# Patient Record
Sex: Female | Born: 1950
Health system: Southern US, Community
[De-identification: ages and names within clinical notes are randomized; demographics above are authoritative.]

## PROBLEM LIST (undated history)

## (undated) DIAGNOSIS — E559 Vitamin D deficiency, unspecified: Secondary | ICD-10-CM

## (undated) DIAGNOSIS — M5412 Radiculopathy, cervical region: Secondary | ICD-10-CM

## (undated) DIAGNOSIS — K219 Gastro-esophageal reflux disease without esophagitis: Secondary | ICD-10-CM

## (undated) DIAGNOSIS — K645 Perianal venous thrombosis: Secondary | ICD-10-CM

## (undated) DIAGNOSIS — M51379 Other intervertebral disc degeneration, lumbosacral region without mention of lumbar back pain or lower extremity pain: Secondary | ICD-10-CM

## (undated) DIAGNOSIS — R739 Hyperglycemia, unspecified: Secondary | ICD-10-CM

## (undated) DIAGNOSIS — M9420 Chondromalacia, unspecified site: Secondary | ICD-10-CM

## (undated) DIAGNOSIS — M5137 Other intervertebral disc degeneration, lumbosacral region: Secondary | ICD-10-CM

## (undated) DIAGNOSIS — R760 Raised antibody titer: Secondary | ICD-10-CM

## (undated) DIAGNOSIS — K2950 Unspecified chronic gastritis without bleeding: Secondary | ICD-10-CM

## (undated) DIAGNOSIS — K5909 Other constipation: Secondary | ICD-10-CM

## (undated) DIAGNOSIS — R202 Paresthesia of skin: Secondary | ICD-10-CM

## (undated) DIAGNOSIS — M7020 Olecranon bursitis, unspecified elbow: Secondary | ICD-10-CM

## (undated) DIAGNOSIS — M542 Cervicalgia: Secondary | ICD-10-CM

## (undated) DIAGNOSIS — E2839 Other primary ovarian failure: Secondary | ICD-10-CM

## (undated) DIAGNOSIS — E785 Hyperlipidemia, unspecified: Secondary | ICD-10-CM

## (undated) DIAGNOSIS — M8580 Other specified disorders of bone density and structure, unspecified site: Secondary | ICD-10-CM

## (undated) HISTORY — DX: Radiculopathy, cervical region: M54.12

## (undated) HISTORY — DX: Hyperglycemia, unspecified: R73.9

## (undated) HISTORY — DX: Other constipation: K59.09

## (undated) HISTORY — DX: Other primary ovarian failure: E28.39

## (undated) HISTORY — DX: Chondromalacia, unspecified site: M94.20

## (undated) HISTORY — DX: Vitamin D deficiency, unspecified: E55.9

## (undated) HISTORY — DX: Perianal venous thrombosis: K64.5

## (undated) HISTORY — DX: Olecranon bursitis, unspecified elbow: M70.20

## (undated) HISTORY — DX: Hyperlipidemia, unspecified: E78.5

## (undated) HISTORY — DX: Paresthesia of skin: R20.2

## (undated) HISTORY — DX: Unspecified chronic gastritis without bleeding: K29.50

## (undated) HISTORY — DX: Raised antibody titer: R76.0

## (undated) HISTORY — PX: DILATION AND CURETTAGE OF UTERUS: SHX78

## (undated) HISTORY — DX: Cervicalgia: M54.2

## (undated) HISTORY — DX: Other intervertebral disc degeneration, lumbosacral region without mention of lumbar back pain or lower extremity pain: M51.379

## (undated) HISTORY — DX: Other specified disorders of bone density and structure, unspecified site: M85.80

## (undated) HISTORY — DX: Other intervertebral disc degeneration, lumbosacral region: M51.37

---
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
## 1989-02-28 HISTORY — PX: CHOLECYSTECTOMY: SHX55

## 2002-03-06 ENCOUNTER — Other Ambulatory Visit: Admission: RE | Admit: 2002-03-06 | Discharge: 2002-03-06 | Payer: Self-pay | Admitting: Obstetrics and Gynecology

## 2003-05-13 ENCOUNTER — Other Ambulatory Visit: Admission: RE | Admit: 2003-05-13 | Discharge: 2003-05-13 | Payer: Self-pay | Admitting: Obstetrics and Gynecology

## 2004-02-26 ENCOUNTER — Ambulatory Visit: Payer: Self-pay | Admitting: *Deleted

## 2004-05-12 ENCOUNTER — Encounter: Admission: RE | Admit: 2004-05-12 | Discharge: 2004-08-10 | Payer: Self-pay | Admitting: Internal Medicine

## 2004-06-21 ENCOUNTER — Other Ambulatory Visit: Admission: RE | Admit: 2004-06-21 | Discharge: 2004-06-21 | Payer: Self-pay | Admitting: Obstetrics and Gynecology

## 2004-10-07 ENCOUNTER — Ambulatory Visit: Payer: Self-pay | Admitting: Family Medicine

## 2009-07-01 ENCOUNTER — Ambulatory Visit: Payer: Self-pay | Admitting: Unknown Physician Specialty

## 2009-08-28 LAB — HM COLONOSCOPY

## 2009-09-09 ENCOUNTER — Ambulatory Visit: Payer: Self-pay | Admitting: Unknown Physician Specialty

## 2009-09-15 LAB — PATHOLOGY REPORT

## 2009-10-09 ENCOUNTER — Ambulatory Visit: Payer: Self-pay | Admitting: Family Medicine

## 2010-06-30 ENCOUNTER — Ambulatory Visit (HOSPITAL_BASED_OUTPATIENT_CLINIC_OR_DEPARTMENT_OTHER)
Admission: RE | Admit: 2010-06-30 | Discharge: 2010-07-01 | Disposition: A | Discharge status: Home / Self Care | Payer: Worker's Compensation | Source: Ambulatory Visit | Attending: Orthopedic Surgery | Admitting: Orthopedic Surgery

## 2010-06-30 DIAGNOSIS — M24139 Other articular cartilage disorders, unspecified wrist: Secondary | ICD-10-CM | POA: Insufficient documentation

## 2010-06-30 DIAGNOSIS — Z01810 Encounter for preprocedural cardiovascular examination: Secondary | ICD-10-CM | POA: Insufficient documentation

## 2010-06-30 DIAGNOSIS — M249 Joint derangement, unspecified: Secondary | ICD-10-CM | POA: Insufficient documentation

## 2010-06-30 DIAGNOSIS — Z01812 Encounter for preprocedural laboratory examination: Secondary | ICD-10-CM | POA: Insufficient documentation

## 2010-06-30 DIAGNOSIS — G5620 Lesion of ulnar nerve, unspecified upper limb: Secondary | ICD-10-CM | POA: Insufficient documentation

## 2010-06-30 LAB — POCT HEMOGLOBIN-HEMACUE: Hemoglobin: 13.1 g/dL (ref 12.0–15.0)

## 2010-07-01 HISTORY — PX: OTHER SURGICAL HISTORY: SHX169

## 2010-09-17 NOTE — Op Note (Signed)
NAMELASHANTE, Dana Arnold                  ACCOUNT NO.:  [id number]  MEDICAL RECORD NO.:  [id number]          PATIENT TYPE:  LOCATION:                                 FACILITY:  PHYSICIAN:  Cindee Salt, M.D.            DATE OF BIRTH:  DATE OF PROCEDURE:  06/30/2010 DATE OF DISCHARGE:                              OPERATIVE REPORT   PREOPERATIVE DIAGNOSIS:  Cubital tunnel syndrome, ulnocarpal abutment, right arm.  POSTOPERATIVE DIAGNOSIS:  Cubital tunnel syndrome, ulnocarpal abutment, right arm.  OPERATIONS: 1. Arthroscopy, right wrist with ulnar shortening osteotomy using 2/4     modular hand plate. 2. Decompression, ulnar nerve, left elbow with debridement triangular     fibrocartilage complex arthroscopically.  SURGEON:  Cindee Salt, MD  ANESTHESIA:  Supraclavicular block general.  ANESTHESIOLOGIST:  Janetta Hora. Gelene Mink, MD.  HISTORY:  The patient is a 60 year old female with a history of ulnar- sided wrist pain, numbness, and tingling.  EMG nerve conduction is positive with changes in her ulnar nerve.  MRI reveals an ulnocarpal abutment with changes in the ulnar aspect of the lunate.  A triangular fibrocartilage complex tear.  We have discussed possibility of surgical decompression, the ulnar shortening osteotomy with arthroscopy, debridement of the wrist, cubital tunnel decompression, possible transposition.  Pre, peri, and postoperative courses have been discussed along with risks and complication.  She is aware that there is no guarantee with the surgery, possibility of infection, recurrence of injury to arteries, nerves, and tendons, incomplete relief of symptoms, dystrophy, possibility of nonunion, delayed union, malunion of the osteotomy site.  In the preoperative area, the patient was seen.  The extremity was marked by both the patient and surgeon.  Antibiotic was given.  DESCRIPTION OF PROCEDURE:  The patient was brought to the operating room where a supraclavicular  block was carried out without difficulty under the direction of Dr. Gelene Mink.  She was prepped using ChloraPrep in supine position with right arm free.  A 3-minute dry time was allowed. Time-out was taken, confirming the patient and procedure.  Following adequate anesthesia, general anesthetic was also given.  She was placed in the arthroscopy tower, 10 pounds of traction was applied.  The joint was inflated to 3/4 portal.  A transverse incision was made through skin only and deepened with a hemostat.  A blunt trocar was used to enter the joint.  Joint was inspected.  Volar radial wrist ligaments were intact. There was stretching of the scapholunate ligament complex.  There were changes on the dorsal aspect of the lunate.  On the ulnar aspect, a central tear of the triangular fibrocartilage complex was noted, indicative of an ulnocarpal abutment.  Rotation of the ulnar head revealed loss of cartilage on the radial aspect of the ulnar head.  A kissing lesion was present on the ulnar aspect of the lunate.  A lunotriquetral tear was noted.  There was significant degenerative changes in the lunotriquetral area with significant fibrillation of the cartilage.  An irrigation catheter was placed in 6U.  A 4-5 portal was opened after localization with 22-gauge needle,  deepened with a hemostat.  A blunt trocar used to enter the joint.  Two-mm shaver was then introduced and debridement was performed.  The scope was then introduced in the ulnar portals.  These allowed visualization of the lunotriquetral tear.  The kissing lesions on the ulna and lunate, a further debridement was performed after returning the scope to the 3/4 portal including partial synovectomy.  The instruments were removed. The limb was removed from the arthroscopy tower.  The tourniquet was then inflated after exsanguinating the limb with an Esmarch bandage. The tourniquet placed high and the arm was inflated to 250  mmHg. Curvilinear incision was then made over the ulnar aspect of the forearm, carried down through subcutaneous tissue.  Bleeders were electrocauterized with bipolar.  Dissection carried between the extensor carpi ulnaris, abductor digiti quinti down to the shaft of the ulna. Periosteum was incised and elevated.  A 7-hole 2/4 modular plate was then placed.  Three holes were drilled distally and screws implanted. These were 11 and 12 mm.  The screws were removed.  A positioning for the osteotomy was then marked.  A osteotome was used to groove the ulna to prevent rotational malalignment.  An oscillating saw with irrigation was then used to remove a segment of bone approximately 1/2-3/4 cm in length.  This one done in the parallel manner.  The screws were then placed onto the plate distally, firmly fixing it distally.  A 4/5 K-wire was placed into one of the proximal holes.  A clamp placed.  The oblique osteotomy was then closed, clamped in position, and an oblique screw measuring 14 mm was then placed.  This was overdrilled on the proximal cortex.  This allowed compression of the osteotomy site.  The proximal screws were then placed.  These were 12 and 11 mm.  These were each eccentrically placed, so as to provide further compression after removal of the oblique screw.  X-rays confirmed positioning of the osteotomy site.  The plate is in good position.  The bone graft was then soaked for 10 minutes during this period of time.  The wound was packed after irrigation.  A separate incision was then made over the medial aspect of the elbow over the medial epicondyle, carried down through subcutaneous tissue.  Bleeders again electrocauterized with bipolar.  An anconeus epitrochlearis muscle was encountered.  This was incised and its anterior aspect of the ulnar nerve was identified.  The fascia was then dissected from the overlying subcutaneous tissue, taking care to protect any superficial  nerves.  Two knee retractors were placed.  This allowed a fasciotomy of the flexor carpi ulnaris to be performed for approximately 6-8 cm proximally.  The muscle belly was then split distally.  The KMI carpal tunnel guide was then placed between the ulnar nerve and the deep fascia and this was released with a ENT angled scissors proximally for the entire aspect.  The nerve was visualized, found to be intact over its entire course.  Bleeders were again electrocauterized with bipolar.  The dissection was then carried proximally.  The knee retractors placed after dissecting the subcutaneous tissue from its proximal fascia.  In a similar manner, the nerve was released proximally with the [facility] guide.  This was done to the arcade of [place].  The area was irrigated.  The elbow flexed.  No subluxation to the nerve was noted.  The fascia anteriorly from the medial epicondyle was sutured to the posterior skin flap with 2-0 Vicryl sutures and the  subcutaneous tissue with interrupted 2-0 Vicryl and skin with a subcuticular 4-0 Vicryl Rapide.  The bone graft was then inserted onto the ulnar shaft osteotomy.  This was packed after irrigation and sutured into position using 2-0 Vicryl and into the periosteum.  The overlying subcutaneous tissue was then closed with interrupted 4-0 Vicryl sutures and the skin with subcuticular 4-0 Vicryl Rapide.  The portals were also closed with 4-0 Vicryl Rapide.  X-rays confirmed positioning of the osteotomy site.  Full pronation, supination was noted.  A sterile compressive dressing long-arm splint applied with the elbow flexed approximately 45 degrees.  On deflation of the tourniquet, all fingers immediately pinked.  She was taken to the recovery room for observation in satisfactory condition.  She will be admitted for overnight stay, discharged home on Percocet as determined by pain and comfort.          ______________________________ Cindee Salt,  M.D.     GK/MEDQ  D:  06/30/2010  T:  07/01/2010  Job:  [id number]  Electronically Signed by Cindee Salt M.D. on 09/17/2010 09:11:56 AM

## 2010-12-30 LAB — HM MAMMOGRAPHY: HM Mammogram: NORMAL

## 2011-01-04 ENCOUNTER — Other Ambulatory Visit: Payer: Self-pay | Admitting: Obstetrics and Gynecology

## 2011-01-04 DIAGNOSIS — R928 Other abnormal and inconclusive findings on diagnostic imaging of breast: Secondary | ICD-10-CM

## 2011-12-30 LAB — HM PAP SMEAR: HM Pap smear: NORMAL

## 2012-01-09 ENCOUNTER — Telehealth: Payer: Self-pay | Admitting: Oncology

## 2012-01-09 NOTE — Telephone Encounter (Signed)
LVOM for pt to return call.  °

## 2012-01-10 ENCOUNTER — Telehealth: Payer: Self-pay | Admitting: Oncology

## 2012-01-10 NOTE — Telephone Encounter (Signed)
C/D 01/10/12 for appt. 01/12/12

## 2012-01-10 NOTE — Telephone Encounter (Signed)
S/W pt in re NP appt 11/14 @ 2:30 w/Dr. Arline Asp  Referring Dr. Henderson Cloud Dx-Abn Coags/Famiy HX Welcome packet emailed.

## 2012-01-12 ENCOUNTER — Encounter: Payer: Self-pay | Admitting: Oncology

## 2012-01-12 ENCOUNTER — Ambulatory Visit (HOSPITAL_BASED_OUTPATIENT_CLINIC_OR_DEPARTMENT_OTHER): Payer: Worker's Compensation

## 2012-01-12 ENCOUNTER — Ambulatory Visit (HOSPITAL_BASED_OUTPATIENT_CLINIC_OR_DEPARTMENT_OTHER): Payer: BC Managed Care – PPO | Admitting: Oncology

## 2012-01-12 ENCOUNTER — Other Ambulatory Visit: Payer: BC Managed Care – PPO | Admitting: Lab

## 2012-01-12 VITALS — BP 126/72 | HR 78 | Temp 97.7°F | Resp 20 | Ht 64.5 in | Wt 131.0 lb

## 2012-01-12 DIAGNOSIS — R7989 Other specified abnormal findings of blood chemistry: Secondary | ICD-10-CM

## 2012-01-12 NOTE — Progress Notes (Signed)
This office note has been dictated.  #[id number]

## 2012-01-13 NOTE — Progress Notes (Signed)
CC:   Guy Sandifer. Henderson Cloud, M.D.  PROBLEM LIST: 1. Abnormal blood test on hypercoagulable panel. 2. Injury to right arm after falling in November 2011 status post     surgery by Dr. Cindee Salt in May 2012. 3. Bursitis involving right knee since January 2012. 4. Degenerative disk disease since 2001. 5. Right retinal tear in September 2011 treated with laser. 6. History of esophageal stricture in 2007 and 2011. 7. Elevated TSH noted on 01/05/2012.  MEDICATIONS: 1. Cholecalciferol 2000 units daily. 2. Flaxseed oil 1 tablespoons daily. 3. Lactobacillus probiotic 1 tablespoon daily. 4. Multivitamins 1 capsule daily. 5. Vitamin C 1000 mg daily.  SMOKING HISTORY:  The patient has never smoked cigarettes.  HISTORY:  Dana Arnold is a 61 year old, white, married female whom I am asked to see in consultation by Dr. Harold Hedge because of an abnormal blood test on a hypercoagulable panel which was carried out on 01/15/2012.  In addition the patient expresses some concerns about her family history.  She is concerned that there may be a predisposing tendency in her family for blood clots or she said "thick blood."  The patient has been generally healthy.  She has no personal history of blood clots or anything in her personal history to suggest a hypercoagulable state.  Her family history will be reviewed in detail below however.  Her family history also fails to reveal any major suggestion of a hypercoagulable state with the exception of a recent injury to her right arm after falling in November 2011 and subsequent surgery in May 2012, the patient seems to be enjoying excellent health. She does have a lot of arthritis particularly in her back but also involving her right knee and feet.  PAST MEDICAL HISTORY:  Fairly unremarkable as noted above.  SURGICAL HISTORY:  Consists of a T and A in 1961.  D and C in 1983, laparoscopic cholecystectomy in January 1991 at [facility] complicated by  postoperative bleeding requiring reoperation.  The patient has had injections into her right knee of steroids.  She has had 2 miscarriages but 3 successful pregnancies resulting in adult children, ages 79, 54 and 78.  No history of any significant complications related to her pregnancies specifically, blood clots.  No history of blood transfusions.  ALLERGIES:  No allergies to any medicines.  FAMILY HISTORY:  Mother apparently had phlebitis at 1 time.  She suffered strokes at age 70 and  age 74, apparently is on Coumadin at this time.  Father died at age 49 of a ruptured cerebral aneurysm. Maternal grandmother apparently had a cerebral hemorrhage at age 68 and 59.  Maternal grandfather had prostate cancer.  The patient has sister age 64 with hypertension, a sister age 85.  She has 3 adult children. No grandchildren.  There is no history of any blood clots in these relatives.  No history of bleeding tendencies.  SOCIAL HISTORY:  [name] denies any history of tobacco or alcohol use. She lives in [place] with her husband.  They have been married 37 years.  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  She continues to work.  She is active and drives.  She is unable to do exercises as she had been doing prior to her right arm injury.  REVIEW OF SYSTEMS:  She denies any neurologic problems.  Vision and hearing are okay.  She did have a right retinal tear in September 2011 treated with laser.  The patient has had intermittent sinus problems requiring  antibiotics.  There have been episodes where she has had C difficile as a result of antibiotic therapy.  She apparently has undergone dilatation of an esophageal stricture associated with dysphagia.  She has tendency toward constipation.  No history of liver problems.  No cardiac or respiratory symptoms.  She has yearly mammogram and Pap test.  She stopped having menstrual periods at age 11.  No history presently of hot  flashes, but she did have hot flashes.  She had been on birth control pills in the past for a year and a half.  She had been on progestational agents for about 4 years, but stopped these within the past few months.  Again, no history of blood clots.  She has had urinary tract infections in the past.  No history of leg swelling intermittent claudication or phlebitis.  No history of abnormal bleeding or bruising.  She has arthritis involving her low back, neck, feet, right knee and right arm.  No fever, chills or night sweats.  No skin rashes or pruritus.  No history of depression.  PHYSICAL EXAM:  [name] is a well-appearing woman of 61.  She looks healthy.  Weight is 131 pounds, height 5 feet 4-1/2 inches, body surface area 1.64 m squared.  Blood pressure 126/72.  Other vital signs are normal.  There is no scleral icterus.  Pupillary and extraocular movements are normal.  Mouth and pharynx are benign.  Dentition is good. Neck is without adenopathy, thyroid enlargement or bruit.  Heart and lungs are normal.  Breasts are not examined.  Back:  No skeletal tenderness or deformity.  No axillary or inguinal adenopathy.  Abdomen: Reveals a midline vertical scar from her gallbladder surgery.  No organomegaly or masses.  No tenderness.  Extremities:  No peripheral edema, clubbing, palmar erythema, petechiae, or purpura.  She has some arthritic changes in her fingers.  Neurologic exam is normal.  LABORATORY DATA:  None was carried out today.  We have a hypercoagulable panel from 01/05/2012 ordered by Dr. Harold Hedge.  The patient was given a copy of this study and results were reviewed with her.  The results are normal with the exception of total protein C which came back 69% with normal being 72-160%.  On the other hand, the protein C functional, which is the activity is 194%, which if anything is increased but of no clinical significance.  In short, the patient does not seem to have a  protein C deficiency on the basis of the functional assay.  She does not have lupus anticoagulant.  Mutational analysis for factor V Leiden and prothrombin II gene mutation were negative. Antithrombin III protein C and protein S were all normal.  Cardiolipin and beta 2 glycoprotein antibodies were normal.  CBC and BMET were normal.  A TSH however, was 5.599 with normal being 0.350-4.500.  This was pointed out to the patient.  She was encouraged to have this repeated with thyroid hormone levels.  She may need a small dose of levothyroxine.  IMPRESSION AND PLAN:  The patient was reassured.  There was nothing in her lab work specifically the hypercoagulable panel or in her family history that would make one think that she was at increased risk for thrombotic events, either venous or arterial.  The patient was given a copy of her lab work.  No followup appointment is indicated or was made. However, I will would be happy to see Ms. Lofland again, should any questions or concerns arise in the  future.    ______________________________ Samul Dada, M.D. DSM/MEDQ  D:  01/12/2012  T:  01/13/2012  Job:  [id number]

## 2012-03-09 ENCOUNTER — Ambulatory Visit: Payer: Self-pay | Admitting: Family Medicine

## 2013-01-30 ENCOUNTER — Ambulatory Visit: Payer: Self-pay | Admitting: Family Medicine

## 2013-01-30 LAB — HM DEXA SCAN: HM Dexa Scan: ABNORMAL

## 2013-04-18 ENCOUNTER — Ambulatory Visit: Payer: Self-pay | Admitting: Unknown Physician Specialty

## 2013-04-23 LAB — PATHOLOGY REPORT

## 2014-05-26 HISTORY — PX: OTHER SURGICAL HISTORY: SHX169

## 2014-12-24 ENCOUNTER — Encounter: Payer: Self-pay | Admitting: Family Medicine

## 2014-12-24 ENCOUNTER — Ambulatory Visit (INDEPENDENT_AMBULATORY_CARE_PROVIDER_SITE_OTHER): Payer: BC Managed Care – PPO | Admitting: Family Medicine

## 2014-12-24 ENCOUNTER — Encounter (INDEPENDENT_AMBULATORY_CARE_PROVIDER_SITE_OTHER): Payer: Self-pay

## 2014-12-24 VITALS — BP 120/80 | HR 101 | Temp 98.3°F | Resp 16 | Ht 65.0 in | Wt 131.2 lb

## 2014-12-24 DIAGNOSIS — M8580 Other specified disorders of bone density and structure, unspecified site: Secondary | ICD-10-CM | POA: Diagnosis not present

## 2014-12-24 DIAGNOSIS — M5136 Other intervertebral disc degeneration, lumbar region: Secondary | ICD-10-CM | POA: Diagnosis not present

## 2014-12-24 DIAGNOSIS — E038 Other specified hypothyroidism: Secondary | ICD-10-CM | POA: Insufficient documentation

## 2014-12-24 DIAGNOSIS — E559 Vitamin D deficiency, unspecified: Secondary | ICD-10-CM

## 2014-12-24 DIAGNOSIS — R790 Abnormal level of blood mineral: Secondary | ICD-10-CM | POA: Diagnosis not present

## 2014-12-24 DIAGNOSIS — Z8619 Personal history of other infectious and parasitic diseases: Secondary | ICD-10-CM | POA: Insufficient documentation

## 2014-12-24 DIAGNOSIS — M1990 Unspecified osteoarthritis, unspecified site: Secondary | ICD-10-CM | POA: Insufficient documentation

## 2014-12-24 DIAGNOSIS — Z23 Encounter for immunization: Secondary | ICD-10-CM | POA: Diagnosis not present

## 2014-12-24 DIAGNOSIS — M629 Disorder of muscle, unspecified: Secondary | ICD-10-CM

## 2014-12-24 DIAGNOSIS — M5412 Radiculopathy, cervical region: Secondary | ICD-10-CM | POA: Diagnosis not present

## 2014-12-24 DIAGNOSIS — M159 Polyosteoarthritis, unspecified: Secondary | ICD-10-CM

## 2014-12-24 DIAGNOSIS — M25511 Pain in right shoulder: Secondary | ICD-10-CM | POA: Diagnosis not present

## 2014-12-24 DIAGNOSIS — Z13220 Encounter for screening for lipoid disorders: Secondary | ICD-10-CM | POA: Diagnosis not present

## 2014-12-24 DIAGNOSIS — M5382 Other specified dorsopathies, cervical region: Secondary | ICD-10-CM

## 2014-12-24 DIAGNOSIS — R739 Hyperglycemia, unspecified: Secondary | ICD-10-CM | POA: Diagnosis not present

## 2014-12-24 DIAGNOSIS — K257 Chronic gastric ulcer without hemorrhage or perforation: Secondary | ICD-10-CM | POA: Insufficient documentation

## 2014-12-24 DIAGNOSIS — K644 Residual hemorrhoidal skin tags: Secondary | ICD-10-CM | POA: Insufficient documentation

## 2014-12-24 DIAGNOSIS — M150 Primary generalized (osteo)arthritis: Secondary | ICD-10-CM

## 2014-12-24 DIAGNOSIS — M8949 Other hypertrophic osteoarthropathy, multiple sites: Secondary | ICD-10-CM

## 2014-12-24 DIAGNOSIS — E039 Hypothyroidism, unspecified: Secondary | ICD-10-CM

## 2014-12-24 DIAGNOSIS — R760 Raised antibody titer: Secondary | ICD-10-CM

## 2014-12-24 DIAGNOSIS — M11261 Other chondrocalcinosis, right knee: Secondary | ICD-10-CM | POA: Insufficient documentation

## 2014-12-24 MED ORDER — DICLOFENAC SODIUM 1 % TD GEL: 4.0000 g | Freq: Four times a day (QID) | TRANSDERMAL | Status: DC

## 2014-12-24 MED ORDER — METAXALONE 800 MG PO TABS: 800.0000 mg | ORAL_TABLET | Freq: Three times a day (TID) | ORAL | Status: DC

## 2014-12-24 NOTE — Progress Notes (Signed)
Name: Dana Arnold   MRN: [id number]    DOB: 01-Sep-1950   Date:12/24/2014       Progress Note  Subjective  Chief Complaint  Chief Complaint  Patient presents with  . Shoulder Pain    right shoulder blade onset 6 weeks worsening.  unknown trauma    HPI  Right Shoulder pain: symptoms started 6 weeks ago, sudden onset, pain on right shoulder blade, at times also has pain on the right anterior shoulder, normal of motion, pain is described as sharp pain, intermittently.  Pain at this time 2-3/10.  Not sure of aggravating or alleviating factors. No change in activity or trauma.  No rashes. Neck pain is still present and decrease in rom.  She states the two pains do not seem to be related  Osteopenia: she is not taking medication for that, even though FRAX was high, she was afraid of taking medication.  Explained the need to reconsider since family history of osteoporosis.   Subclinical hypothyroidism; she denies weight gain, has fatigue ( but stressed about her mother that is in a nursing home and her daughter also had complications during her pregnancy)  Hyperglycemia: denies polyphagia, polydipsia or polyuria. No family history of DM.   Primary OA; on hands, feet, DDD lumbar spine, using Voltaren gel to control symptoms.    Patient Active Problem List   Diagnosis Date Noted  . Arthritis, degenerative 12/24/2014  . Lupus anticoagulant positive 12/24/2014  . Hyperglycemia 12/24/2014  . Cervical radiculitis 12/24/2014  . Subclinical hypothyroidism 12/24/2014  . Vitamin D deficiency 12/24/2014  . Hx of Clostridium difficile infection 12/24/2014  . External hemorrhoid 12/24/2014  . Chronic gastric erosion 12/24/2014  . Chondrocalcinosis of right knee 12/24/2014  . Osteopenia 12/24/2014  . DDD (degenerative disc disease), lumbar 12/24/2014    Past Surgical History  Procedure Laterality Date  . Arthroscopic knee Right 05/26/2014  . Right ulna surgery Right 07/01/2010  .  Tonsillectomy and adenoidectomy Bilateral 1961  . Cholecystectomy  02/1989    Family History  Problem Relation Age of Onset  . Osteoporosis Mother   . Aneurysm Father     Social History   Social History  . Marital Status: Married    Spouse Name: N/A  . Number of Children: N/A  . Years of Education: N/A   Occupational History  . Not on file.   Social History Main Topics  . Smoking status: Never Smoker   . Smokeless tobacco: Never Used  . Alcohol Use: No  . Drug Use: No  . Sexual Activity: Yes    Birth Control/ Protection: Post-menopausal   Other Topics Concern  . Not on file   Social History Narrative     Current outpatient prescriptions:  .  magnesium 30 MG tablet, Take 100 mg by mouth 2 (two) times daily., Disp: , Rfl:  .  cholecalciferol (VITAMIN D) 1000 UNITS tablet, Take 2,000 Units by mouth daily., Disp: , Rfl:  .  diclofenac sodium (VOLTAREN) 1 % GEL, Apply 4 g topically 4 (four) times daily., Disp: 100 g, Rfl: 2 .  Lactobacillus (PROBIOTIC ACIDOPHILUS PO), Take by mouth. One tablespoon daily, Disp: , Rfl:   Not on File   ROS  Constitutional: Negative for fever or weight change.  Respiratory: Negative for cough and shortness of breath.   Cardiovascular: Negative for chest pain or palpitations.  Gastrointestinal: Negative for abdominal pain, no bowel changes.  Musculoskeletal: Negative for gait problem or joint swelling.  Skin: Negative for rash.  Neurological: Negative for dizziness , at times some  headache.  No other specific complaints in a complete review of systems (except as listed in HPI above).  Objective  Filed Vitals:   12/24/14 0814  BP: 120/80  Pulse: 101  Temp: 98.3 F (36.8 C)  TempSrc: Oral  Resp: 16  Height: 5\' 5"  (1.651 m)  Weight: 131 lb 3.2 oz (59.512 kg)  SpO2: 97%    Body mass index is 21.83 kg/(m^2).  Physical Exam  Constitutional: Patient appears well-developed and well-nourished. No distress.  HEENT: head  atraumatic, normocephalic, pupils equal and reactive to light, neck supple, throat within normal limits Cardiovascular: Normal rate, regular rhythm and normal heart sounds.  No murmur heard. No BLE edema. Pulmonary/Chest: Effort normal and breath sounds normal. No respiratory distress. Abdominal: Soft.  There is no tenderness. Muscular Skeletal: PIP, DIP are  Enlarged,  Normal ROM of shoulder, tender during palpation of trapezium muscle from neck down to thoracic spine and insertion of shoulder Psychiatric: Patient has a normal mood and affect. behavior is normal. Judgment and thought content normal.   PHQ2/9: Depression screen PHQ 2/9 12/24/2014  Decreased Interest 0  Down, Depressed, Hopeless 0  PHQ - 2 Score 0    Fall Risk: Fall Risk  12/24/2014  Falls in the past year? No    Functional Status Survey: Is the patient deaf or have difficulty hearing?: No Does the patient have difficulty seeing, even when wearing glasses/contacts?: Yes (reading glasses) Does the patient have difficulty concentrating, remembering, or making decisions?: No Does the patient have difficulty walking or climbing stairs?: No Does the patient have difficulty dressing or bathing?: No Does the patient have difficulty doing errands alone such as visiting a doctor's office or shopping?: No    Assessment & Plan  1. Right shoulder pain  Seems muscular, pain on right trapezium muscle during palpation, we will try muscle relaxer, also continue massage therapy  2. Subclinical hypothyroidism  - TSH  3. Lupus anticoagulant positive  Discussed going back to see Dr. Gavin Potters - Lupus anticoagulant panel  4. Hyperglycemia  - Hemoglobin A1c  5. Cervical radiculitis  stable  6. Vitamin D deficiency  - Vit D  25 hydroxy (rtn osteoporosis monitoring)  7. Lipid screening  - Lipid panel  8. Osteopenia  - Comprehensive metabolic panel  9. Primary osteoarthritis involving multiple joints  Continue  Voltaren gel  10. DDD (degenerative disc disease), lumbar  Multiple joint problems, also osteopenia, refer to Dr. Gavin Potters to discuss options with her  22. Need for Tdap vaccination  - Tdap vaccine greater than or equal to 7yo IM  12. Musculoskeletal disorder involving upper trapezius muscle  - metaxalone (SKELAXIN) 800 MG tablet; Take 1 tablet (800 mg total) by mouth 3 (three) times daily.  Dispense: 90 tablet; Refill: 0 Discussed trigger point injections   13. Need for shingles vaccine  Not given because she will see her newborn grandchild this weekend, but she will return soon to get it

## 2014-12-25 ENCOUNTER — Encounter: Payer: Self-pay | Admitting: Family Medicine

## 2014-12-25 LAB — LIPID PANEL
Chol/HDL Ratio: 2.7 ratio units (ref 0.0–4.4)
Cholesterol, Total: 220 mg/dL — ABNORMAL HIGH (ref 100–199)
HDL: 81 mg/dL (ref >39)
LDL Calculated: 126 mg/dL — ABNORMAL HIGH (ref 0–99)
Triglycerides: 67 mg/dL (ref 0–149)
VLDL Cholesterol Cal: 13 mg/dL (ref 5–40)

## 2014-12-25 LAB — COMPREHENSIVE METABOLIC PANEL
ALT: 15 IU/L (ref 0–32)
AST: 23 IU/L (ref 0–40)
Albumin/Globulin Ratio: 1.7 (ref 1.1–2.5)
Albumin: 4.1 g/dL (ref 3.6–4.8)
Alkaline Phosphatase: 71 IU/L (ref 39–117)
BUN/Creatinine Ratio: 22 (ref 11–26)
BUN: 13 mg/dL (ref 8–27)
Bilirubin Total: 0.5 mg/dL (ref 0.0–1.2)
CO2: 27 mmol/L (ref 18–29)
Calcium: 9.5 mg/dL (ref 8.7–10.3)
Chloride: 103 mmol/L (ref 97–106)
Creatinine, Ser: 0.6 mg/dL (ref 0.57–1.00)
GFR calc Af Amer: 114 mL/min/{1.73_m2} (ref >59)
GFR calc non Af Amer: 99 mL/min/{1.73_m2} (ref >59)
Globulin, Total: 2.4 g/dL (ref 1.5–4.5)
Glucose: 74 mg/dL (ref 65–99)
Potassium: 4.6 mmol/L (ref 3.5–5.2)
Sodium: 145 mmol/L — ABNORMAL HIGH (ref 136–144)
Total Protein: 6.5 g/dL (ref 6.0–8.5)

## 2014-12-25 LAB — CBC WITH DIFFERENTIAL/PLATELET
Basophils Absolute: 0 10*3/uL (ref 0.0–0.2)
Basos: 1 %
EOS (ABSOLUTE): 0.1 10*3/uL (ref 0.0–0.4)
Eos: 1 %
Hematocrit: 41.6 % (ref 34.0–46.6)
Hemoglobin: 13.9 g/dL (ref 11.1–15.9)
Immature Grans (Abs): 0 10*3/uL (ref 0.0–0.1)
Immature Granulocytes: 0 %
Lymphocytes Absolute: 1.9 10*3/uL (ref 0.7–3.1)
Lymphs: 38 %
MCH: 30.6 pg (ref 26.6–33.0)
MCHC: 33.4 g/dL (ref 31.5–35.7)
MCV: 92 fL (ref 79–97)
Monocytes Absolute: 0.4 10*3/uL (ref 0.1–0.9)
Monocytes: 9 %
Neutrophils Absolute: 2.5 10*3/uL (ref 1.4–7.0)
Neutrophils: 51 %
Platelets: 258 10*3/uL (ref 150–379)
RBC: 4.54 x10E6/uL (ref 3.77–5.28)
RDW: 13 % (ref 12.3–15.4)
WBC: 4.9 10*3/uL (ref 3.4–10.8)

## 2014-12-25 LAB — TSH: TSH: 3.87 u[IU]/mL (ref 0.450–4.500)

## 2014-12-25 LAB — HEMOGLOBIN A1C
Est. average glucose Bld gHb Est-mCnc: 117 mg/dL
Hgb A1c MFr Bld: 5.7 % — ABNORMAL HIGH (ref 4.8–5.6)

## 2014-12-25 LAB — VITAMIN D 25 HYDROXY (VIT D DEFICIENCY, FRACTURES): Vit D, 25-Hydroxy: 36.6 ng/mL (ref 30.0–100.0)

## 2014-12-26 LAB — LUPUS ANTICOAGULANT PANEL

## 2014-12-29 LAB — PTH-RELATED PEPTIDE: PTH-related peptide: <1.1 pmol/L

## 2015-01-09 ENCOUNTER — Telehealth: Payer: Self-pay | Admitting: Family Medicine

## 2015-01-09 NOTE — Telephone Encounter (Signed)
PT SAID THAT SHE IS NEEDING A NOTE THAT IS STATING MEDICAL NECCESITY FOR HER TO HAVE MEDICAL MASSAGE THERAPY AT [facility] IN [place].  THERE FAX NUMBER IS [phone].

## 2015-01-09 NOTE — Telephone Encounter (Signed)
Forwarded to Dr. Sowles  

## 2015-01-10 LAB — HM MAMMOGRAPHY: HM Mammogram: NEGATIVE

## 2015-01-11 NOTE — Telephone Encounter (Signed)
Please write it . Thank you

## 2015-01-13 NOTE — Telephone Encounter (Signed)
Done and patient notified.

## 2015-01-26 ENCOUNTER — Other Ambulatory Visit: Payer: Self-pay

## 2015-01-26 DIAGNOSIS — E2839 Other primary ovarian failure: Secondary | ICD-10-CM

## 2015-01-30 LAB — HM PAP SMEAR: HM Pap smear: NEGATIVE

## 2015-02-26 ENCOUNTER — Ambulatory Visit
Admission: RE | Admit: 2015-02-26 | Discharge: 2015-02-26 | Disposition: A | Discharge status: Home / Self Care | Payer: BC Managed Care – PPO | Source: Ambulatory Visit | Attending: Family Medicine | Admitting: Family Medicine

## 2015-02-26 DIAGNOSIS — E2839 Other primary ovarian failure: Secondary | ICD-10-CM

## 2015-02-27 ENCOUNTER — Emergency Department
Admission: EM | Admit: 2015-02-27 | Discharge: 2015-02-27 | Disposition: A | Discharge status: Home / Self Care | Payer: BC Managed Care – PPO | Attending: Student | Admitting: Student

## 2015-02-27 ENCOUNTER — Encounter: Payer: Self-pay | Admitting: Medical Oncology

## 2015-02-27 ENCOUNTER — Emergency Department: Payer: BC Managed Care – PPO

## 2015-02-27 DIAGNOSIS — R079 Chest pain, unspecified: Secondary | ICD-10-CM | POA: Insufficient documentation

## 2015-02-27 DIAGNOSIS — Z791 Long term (current) use of non-steroidal anti-inflammatories (NSAID): Secondary | ICD-10-CM | POA: Diagnosis not present

## 2015-02-27 DIAGNOSIS — Z79899 Other long term (current) drug therapy: Secondary | ICD-10-CM | POA: Insufficient documentation

## 2015-02-27 HISTORY — DX: Gastro-esophageal reflux disease without esophagitis: K21.9

## 2015-02-27 LAB — BASIC METABOLIC PANEL
Anion gap: 3 — ABNORMAL LOW (ref 5–15)
BUN: 12 mg/dL (ref 6–20)
CO2: 31 mmol/L (ref 22–32)
Calcium: 9.3 mg/dL (ref 8.9–10.3)
Chloride: 105 mmol/L (ref 101–111)
Creatinine, Ser: 0.63 mg/dL (ref 0.44–1.00)
GFR calc Af Amer: >60 mL/min (ref >60)
GFR calc non Af Amer: >60 mL/min (ref >60)
Glucose, Bld: 98 mg/dL (ref 65–99)
Potassium: 3.8 mmol/L (ref 3.5–5.1)
Sodium: 139 mmol/L (ref 135–145)

## 2015-02-27 LAB — CBC WITH DIFFERENTIAL/PLATELET
Basophils Absolute: 0 10*3/uL (ref 0–0.1)
Basophils Relative: 1 %
Eosinophils Absolute: 0.1 10*3/uL (ref 0–0.7)
Eosinophils Relative: 3 %
HCT: 39.7 % (ref 35.0–47.0)
Hemoglobin: 13.1 g/dL (ref 12.0–16.0)
Lymphocytes Relative: 35 %
Lymphs Abs: 1.6 10*3/uL (ref 1.0–3.6)
MCH: 29.7 pg (ref 26.0–34.0)
MCHC: 32.9 g/dL (ref 32.0–36.0)
MCV: 90.3 fL (ref 80.0–100.0)
Monocytes Absolute: 0.4 10*3/uL (ref 0.2–0.9)
Monocytes Relative: 9 %
Neutro Abs: 2.4 10*3/uL (ref 1.4–6.5)
Neutrophils Relative %: 52 %
Platelets: 216 10*3/uL (ref 150–440)
RBC: 4.39 MIL/uL (ref 3.80–5.20)
RDW: 13.6 % (ref 11.5–14.5)
WBC: 4.5 10*3/uL (ref 3.6–11.0)

## 2015-02-27 LAB — TROPONIN I: Troponin I: <0.03 ng/mL (ref <0.031)

## 2015-02-27 MED ORDER — ASPIRIN 81 MG PO CHEW
324.0000 mg | CHEWABLE_TABLET | Freq: Once | ORAL | Status: AC
  Administered 2015-02-27: 324 mg via ORAL
  Filled 2015-02-27: qty 4

## 2015-02-27 NOTE — ED Notes (Signed)
Pt reports that she was woke from her sleep this am around 0330 with central chest pain, patient denies radiation of pain or sob. Pt reports that the pain lasted about and went away but now she continues to have a soreness feeling to center of chest. In past has had to have esophagus stretched and feels similar.

## 2015-02-27 NOTE — ED Provider Notes (Addendum)
[facility] Emergency Department Provider Note  ____________________________________________  Time seen: Approximately 1:10 PM  I have reviewed the triage vital signs and the nursing notes.   HISTORY  Chief Complaint Chest Pain    HPI Dana Arnold is a 64 y.o. female with history of hyperlipidemia and GERD as well as esophageal strictures requiring dilatation 2007 and 2011 who presents for evaluation of central chest pain which woke her from sleep at 3:30 AM this morning. The patient reports that she woke this morning with 9 out of 10 in central chest pain which felt similar to prior esophageal spasms however it lasted approximately an hour which is longer than her usual spasms. She went back to sleep and awoke at approximately 8 AM with some continued mild chest soreness. Since that time her pain has completely resolved. She called her GI doctor and her family medicine doctor who referred her to the emergency department after she called and told him about her symptoms. She reports that the pain was not worse with exertion, not associated with any shortness of breath, no diaphoresis. Symptoms improved this morning after she drank some cola and began burping. He did not rip or tear in nature. Pain does not radiate to her back. Pain is not pleuritic in nature per she has no history of coronary artery disease. No history of DVT or PE   Past Medical History  Diagnosis Date  . Hyperlipidemia   . GERD (gastroesophageal reflux disease)     Patient Active Problem List   Diagnosis Date Noted  . Arthritis, degenerative 12/24/2014  . Hyperglycemia 12/24/2014  . Cervical radiculitis 12/24/2014  . Subclinical hypothyroidism 12/24/2014  . Vitamin D deficiency 12/24/2014  . Hx of Clostridium difficile infection 12/24/2014  . External hemorrhoid 12/24/2014  . Chronic gastric erosion 12/24/2014  . Chondrocalcinosis of right knee 12/24/2014  . Osteopenia 12/24/2014  . DDD  (degenerative disc disease), lumbar 12/24/2014  . Need for shingles vaccine 12/24/2014    Past Surgical History  Procedure Laterality Date  . Arthroscopic knee Right 05/26/2014  . Right ulna surgery Right 07/01/2010  . Tonsillectomy and adenoidectomy Bilateral 1961  . Cholecystectomy  02/1989    Current Outpatient Rx  Name  Route  Sig  Dispense  Refill  . cholecalciferol (VITAMIN D) 1000 UNITS tablet   Oral   Take 2,000 Units by mouth daily.         . diclofenac sodium (VOLTAREN) 1 % GEL   Topical   Apply 4 g topically 4 (four) times daily.   100 g   2   . Lactobacillus (PROBIOTIC ACIDOPHILUS PO)   Oral   Take by mouth. One tablespoon daily         . magnesium 30 MG tablet   Oral   Take 100 mg by mouth 2 (two) times daily.         . metaxalone (SKELAXIN) 800 MG tablet   Oral   Take 1 tablet (800 mg total) by mouth 3 (three) times daily.   90 tablet   0     Allergies Review of patient's allergies indicates no known allergies.  Family History  Problem Relation Age of Onset  . Osteoporosis Mother   . Aneurysm Father     Social History Social History  Substance Use Topics  . Smoking status: Never Smoker   . Smokeless tobacco: Never Used  . Alcohol Use: No    Review of Systems Constitutional: No fever/chills Eyes: No visual  changes. ENT: No sore throat. Cardiovascular: + chest pain. Respiratory: Denies shortness of breath. Gastrointestinal: No abdominal pain.  No nausea, no vomiting.  No diarrhea.  No constipation. Genitourinary: Negative for dysuria. Musculoskeletal: Negative for back pain. Skin: Negative for rash. Neurological: Negative for headaches, focal weakness or numbness.  10-point ROS otherwise negative.  ____________________________________________   PHYSICAL EXAM:  VITAL SIGNS: ED Triage Vitals  Enc Vitals Group     BP 02/27/15 1244 126/65 mmHg     Pulse Rate 02/27/15 1244 78     Resp 02/27/15 1244 18     Temp 02/27/15  1244 97.8 F (36.6 C)     Temp Source 02/27/15 1244 Oral     SpO2 02/27/15 1244 99 %     Weight 02/27/15 1244 133 lb (60.328 kg)     Height 02/27/15 1244  (1.651 m)     Head Cir --      Peak Flow --      Pain Score 02/27/15 1246 1     Pain Loc --      Pain Edu? --      Excl. in GC? --     Constitutional: Alert and oriented. Well appearing and in no acute distress. Eyes: Conjunctivae are normal. PERRL. EOMI. Head: Atraumatic. Nose: No congestion/rhinnorhea. Mouth/Throat: Mucous membranes are moist.  Oropharynx non-erythematous. Neck: No stridor.   Cardiovascular: Normal rate, regular rhythm. Grossly normal heart sounds.  Good peripheral circulation. Respiratory: Normal respiratory effort.  No retractions. Lungs CTAB. Gastrointestinal: Soft and nontender. No distention.  No CVA tenderness. Genitourinary: deferred Musculoskeletal: No lower extremity tenderness nor edema.  No joint effusions. Neurologic:  Normal speech and language. No gross focal neurologic deficits are appreciated.  Skin:  Skin is warm, dry and intact. No rash noted. Psychiatric: Mood and affect are normal. Speech and behavior are normal.  ____________________________________________   LABS (all labs ordered are listed, but only abnormal results are displayed)  Labs Reviewed  BASIC METABOLIC PANEL - Abnormal; Notable for the following:    Anion gap 3 (*)    All other components within normal limits  CBC WITH DIFFERENTIAL/PLATELET  TROPONIN I   ____________________________________________  EKG  ED ECG REPORT I, Gayla Doss, the attending physician, personally viewed and interpreted this ECG.   Date: 02/27/2015  EKG Time: 12:42  Rate: 78  Rhythm: normal sinus rhythm  Axis: normal  Intervals:none  ST&T Change: No acute ST elevation. Q waves in V2, V3. EKG is similar to EKG obtained on 06/30/2010 when accounting for difference in lead  placement.  ____________________________________________  RADIOLOGY  CXR IMPRESSION: No active cardiopulmonary disease.  ____________________________________________   PROCEDURES  Procedure(s) performed: None  Critical Care performed: No  ____________________________________________   INITIAL IMPRESSION / ASSESSMENT AND PLAN / ED COURSE  Pertinent labs & imaging results that were available during my care of the patient were reviewed by me and considered in my medical decision making (see chart for details).  Dana Arnold is a 64 y.o. female with history of hyperlipidemia and GERD as well as esophageal strictures requiring dilatation 2007 and 2011 who presents for evaluation of central chest pain which woke her from sleep at 3:30 AM this morning, now resolved. On exam, she is very well-appearing and in no acute distress per sitting up in bed, talkative, pleasant, making jokes. Is a benign exam and vital signs are stable, she is afebrile. EKG is unchanged from prior, not consistent with acute ischemia, troponin less than 0.03  drawn nearly 10 hours after the onset of her pain. Doubt ACS. No risk factors for PE and I doubt this diagnosis in this patient. Pain not ripping or tearing in nature, not radiating, clinical picture not consistent with acute aortic dissection. Suspect her pain today was secondary to esophageal spasm/GI related given history of similar discomfort and resolution with burping and drinking cola. We discussed return precautions, need for close PCP and GI follow-up and she is comfortable with the discharge plan. DC home. ASA given. ____________________________________________   FINAL CLINICAL IMPRESSION(S) / ED DIAGNOSES  Final diagnoses:  Chest pain, unspecified chest pain type      Gayla Doss, MD 02/27/15 1430  Gayla Doss, MD 02/27/15 1430

## 2015-03-03 NOTE — Progress Notes (Signed)
Appointment made for next week.

## 2015-03-10 ENCOUNTER — Ambulatory Visit (INDEPENDENT_AMBULATORY_CARE_PROVIDER_SITE_OTHER): Payer: BC Managed Care – PPO | Admitting: Family Medicine

## 2015-03-10 ENCOUNTER — Encounter: Payer: Self-pay | Admitting: Family Medicine

## 2015-03-10 VITALS — BP 112/76 | HR 83 | Temp 98.1°F | Resp 16 | Ht 65.0 in | Wt 135.7 lb

## 2015-03-10 DIAGNOSIS — M8949 Other hypertrophic osteoarthropathy, multiple sites: Secondary | ICD-10-CM

## 2015-03-10 DIAGNOSIS — M9420 Chondromalacia, unspecified site: Secondary | ICD-10-CM | POA: Insufficient documentation

## 2015-03-10 DIAGNOSIS — K219 Gastro-esophageal reflux disease without esophagitis: Secondary | ICD-10-CM | POA: Diagnosis not present

## 2015-03-10 DIAGNOSIS — M8580 Other specified disorders of bone density and structure, unspecified site: Secondary | ICD-10-CM

## 2015-03-10 DIAGNOSIS — M150 Primary generalized (osteo)arthritis: Secondary | ICD-10-CM | POA: Diagnosis not present

## 2015-03-10 DIAGNOSIS — K257 Chronic gastric ulcer without hemorrhage or perforation: Secondary | ICD-10-CM

## 2015-03-10 DIAGNOSIS — M159 Polyosteoarthritis, unspecified: Secondary | ICD-10-CM

## 2015-03-10 MED ORDER — RANITIDINE HCL 300 MG PO CAPS: 300.0000 mg | ORAL_CAPSULE | Freq: Every day | ORAL | Status: DC | PRN

## 2015-03-10 NOTE — Progress Notes (Signed)
Name: Dana Arnold   MRN: [id number]    DOB: 20-Aug-1950   Date:03/10/2015       Progress Note  Subjective  Chief Complaint  Chief Complaint  Patient presents with  . Osteoporosis    Patient just went for Bone Density and was told to come in to discuss therapy. Patient states she has DDD in her lower lumbra area and arthritis in bilateral arms.    HPI  Osteopenia: she was seen by Dr. Jefm Bryant, Hip risk of fracture was 2% and total risk of fracture 19.7%. She does not want to try medication at this time. She will try to wean off Omeprazole and increase dietary intake of dairy and meals rich in vitamin D ( sardines, fortified orange juice ).    GERD/Gastritis: she sees Dr. Vira Agar, she was off Omeprazole for 3 months during the fall, but resumed because of some recurrence of increased in regurgitation symptoms during the holidays.  She is willing to try switching to H 2 blockers.   OA: seen again by Dr. Jefm Bryant and advised to continue prn medication. She only uses Voltaren gel occasionally, pain is worse on both index fingers. Mild deformities of both hands secondary to OA    Patient Active Problem List   Diagnosis Date Noted  . Chondromalacia 03/10/2015  . Arthritis, degenerative 12/24/2014  . Hyperglycemia 12/24/2014  . Cervical radiculitis 12/24/2014  . Subclinical hypothyroidism 12/24/2014  . Vitamin D deficiency 12/24/2014  . Hx of Clostridium difficile infection 12/24/2014  . External hemorrhoid 12/24/2014  . Chronic gastric erosion 12/24/2014  . Chondrocalcinosis of right knee 12/24/2014  . Osteopenia 12/24/2014  . DDD (degenerative disc disease), lumbar 12/24/2014  . Need for shingles vaccine 12/24/2014    Past Surgical History  Procedure Laterality Date  . Arthroscopic knee Right 05/26/2014  . Right ulna surgery Right 07/01/2010  . Tonsillectomy and adenoidectomy Bilateral 1961  . Cholecystectomy  02/1989    Family History  Problem Relation Age of Onset  .  Osteoporosis Mother   . Aneurysm Father     Social History   Social History  . Marital Status: Married    Spouse Name: N/A  . Number of Children: N/A  . Years of Education: N/A   Occupational History  . Not on file.   Social History Main Topics  . Smoking status: Never Smoker   . Smokeless tobacco: Never Used  . Alcohol Use: No  . Drug Use: No  . Sexual Activity: Yes    Birth Control/ Protection: Post-menopausal   Other Topics Concern  . Not on file   Social History Narrative     Current outpatient prescriptions:  .  cholecalciferol (VITAMIN D) 1000 UNITS tablet, Take 2,000 Units by mouth daily., Disp: , Rfl:  .  diclofenac sodium (VOLTAREN) 1 % GEL, Apply 4 g topically 4 (four) times daily., Disp: 100 g, Rfl: 2 .  fluticasone (FLONASE) 50 MCG/ACT nasal spray, USE 1 SPRAY IN EACH NOSTRIL ONCE A DAY, Disp: , Rfl:  .  Lactobacillus (PROBIOTIC ACIDOPHILUS PO), Take by mouth. One tablespoon daily, Disp: , Rfl:  .  magnesium 30 MG tablet, Take 100 mg by mouth daily. , Disp: , Rfl:  .  omeprazole (PRILOSEC) 20 MG capsule, Take 1 capsule by mouth daily., Disp: , Rfl:  .  ranitidine (ZANTAC) 300 MG capsule, Take 1 capsule (300 mg total) by mouth daily as needed for heartburn., Disp: 30 capsule, Rfl: 2  No Known Allergies   ROS  Constitutional: Negative for fever or weight change.  Respiratory: Negative for cough and shortness of breath.   Cardiovascular: Negative for chest pain or palpitations.  Gastrointestinal: Negative for abdominal pain, no bowel changes.  Musculoskeletal: Negative for gait problem , positive for joint swelling.  Skin: Negative for rash.  Neurological: Negative for dizziness or headache.  No other specific complaints in a complete review of systems (except as listed in HPI above).  Objective  Filed Vitals:   03/10/15 1452  BP: 112/76  Pulse: 83  Temp: 98.1 F (36.7 C)  TempSrc: Oral  Resp: 16  Height: '5\' 5"'$  (1.651 m)  Weight: 135 lb 11.2 oz  (61.553 kg)  SpO2: 97%    Body mass index is 22.58 kg/(m^2).  Physical Exam  Constitutional: Patient appears well-developed and well-nourished. No distress.  HEENT: head atraumatic, normocephalic, pupils equal and reactive to light, neck supple, throat within normal limits Cardiovascular: Normal rate, regular rhythm and normal heart sounds.  No murmur heard. No BLE edema. Pulmonary/Chest: Effort normal and breath sounds normal. No respiratory distress. Abdominal: Soft.  There is no tenderness. Psychiatric: Patient has a normal mood and affect. behavior is normal. Judgment and thought content normal. Muscular Skeletal: DID deformities both hands.   Recent Results (from the past 2160 hour(s))  Vit D  25 hydroxy (rtn osteoporosis monitoring)     Status: None   Collection Time: 12/24/14  9:24 AM  Result Value Ref Range   Vit D, 25-Hydroxy 36.6 30.0 - 100.0 ng/mL    Comment: Vitamin D deficiency has been defined by the [place] practice guideline as a level of serum 25-OH vitamin D less than 20 ng/mL (1,2). The Endocrine Society went on to further define vitamin D insufficiency as a level between 21 and 29 ng/mL (2). 1. IOM (Institute of Medicine). 2010. Dietary reference    intakes for calcium and D. [place]: The    [employer]. 2. Holick MF, Binkley [place], Bischoff-Ferrari HA, et al.    Evaluation, treatment, and prevention of vitamin D    deficiency: an Endocrine Society clinical practice    guideline. JCEM. 2011 Jul; 96(7):1911-30.   TSH     Status: None   Collection Time: 12/24/14  9:24 AM  Result Value Ref Range   TSH 3.870 0.450 - 4.500 uIU/mL  Lipid panel     Status: Abnormal   Collection Time: 12/24/14  9:24 AM  Result Value Ref Range   Cholesterol, Total 220 (H) 100 - 199 mg/dL   Triglycerides 67 0 - 149 mg/dL   HDL 81 >39 mg/dL    Comment: According to ATP-III Guidelines, HDL-C >59 mg/dL is considered a negative  risk factor for CHD.    VLDL Cholesterol Cal 13 5 - 40 mg/dL   LDL Calculated 126 (H) 0 - 99 mg/dL   Chol/HDL Ratio 2.7 0.0 - 4.4 ratio units    Comment:                                   T. Chol/HDL Ratio                                             Men  Women  1/2 Avg.Risk  3.4    3.3                                   Avg.Risk  5.0    4.4                                2X Avg.Risk  9.6    7.1                                3X Avg.Risk 23.4   11.0   Hemoglobin A1c     Status: Abnormal   Collection Time: 12/24/14  9:24 AM  Result Value Ref Range   Hgb A1c MFr Bld 5.7 (H) 4.8 - 5.6 %    Comment:          Pre-diabetes: 5.7 - 6.4          Diabetes: >6.4          Glycemic control for adults with diabetes: <7.0    Est. average glucose Bld gHb Est-mCnc 117 mg/dL  Comprehensive metabolic panel     Status: Abnormal   Collection Time: 12/24/14  9:24 AM  Result Value Ref Range   Glucose 74 65 - 99 mg/dL   BUN 13 8 - 27 mg/dL   Creatinine, Ser 0.60 0.57 - 1.00 mg/dL   GFR calc non Af Amer 99 >59 mL/min/1.73   GFR calc Af Amer 114 >59 mL/min/1.73   BUN/Creatinine Ratio 22 11 - 26   Sodium 145 (H) 136 - 144 mmol/L    Comment:               **Please note reference interval change**   Potassium 4.6 3.5 - 5.2 mmol/L    Comment:               **Please note reference interval change**   Chloride 103 97 - 106 mmol/L    Comment:               **Please note reference interval change**   CO2 27 18 - 29 mmol/L   Calcium 9.5 8.7 - 10.3 mg/dL   Total Protein 6.5 6.0 - 8.5 g/dL   Albumin 4.1 3.6 - 4.8 g/dL   Globulin, Total 2.4 1.5 - 4.5 g/dL   Albumin/Globulin Ratio 1.7 1.1 - 2.5   Bilirubin Total 0.5 0.0 - 1.2 mg/dL   Alkaline Phosphatase 71 39 - 117 IU/L   AST 23 0 - 40 IU/L   ALT 15 0 - 32 IU/L  Lupus anticoagulant panel     Status: None   Collection Time: 12/24/14  9:24 AM  Result Value Ref Range   Lupus Anticoag Interp Comment:     Comment: No lupus  anticoagulant was detected.  CBC with Differential/Platelet     Status: None   Collection Time: 12/24/14  9:24 AM  Result Value Ref Range   WBC 4.9 3.4 - 10.8 x10E3/uL   RBC 4.54 3.77 - 5.28 x10E6/uL   Hemoglobin 13.9 11.1 - 15.9 g/dL   Hematocrit 41.6 34.0 - 46.6 %   MCV 92 79 - 97 fL   MCH 30.6 26.6 - 33.0 pg   MCHC 33.4 31.5 - 35.7 g/dL   RDW 13.0 12.3 - 15.4 %   Platelets 258 150 -  379 x10E3/uL   Neutrophils 51 %   Lymphs 38 %   Monocytes 9 %   Eos 1 %   Basos 1 %   Neutrophils Absolute 2.5 1.4 - 7.0 x10E3/uL   Lymphocytes Absolute 1.9 0.7 - 3.1 x10E3/uL   Monocytes Absolute 0.4 0.1 - 0.9 x10E3/uL   EOS (ABSOLUTE) 0.1 0.0 - 0.4 x10E3/uL   Basophils Absolute 0.0 0.0 - 0.2 x10E3/uL   Immature Granulocytes 0 %   Immature Grans (Abs) 0.0 0.0 - 0.1 x10E3/uL  PTH-related peptide     Status: None   Collection Time: 12/24/14  9:24 AM  Result Value Ref Range   PTH-related peptide <1.1 pmol/L    Comment: Reference Range: All Ages: <2.0 The PTHrP assay should not be used to exclude cancer or screen tumor patients for humoral hypercalcemia of malignancy (HHM). The results should always be assessed in conjunction with the patient's medical history, clinical examination, and other findings. If test results are clinically discordant, please contact the laboratory.   CBC with Differential     Status: None   Collection Time: 02/27/15 12:54 PM  Result Value Ref Range   WBC 4.5 3.6 - 11.0 K/uL   RBC 4.39 3.80 - 5.20 MIL/uL   Hemoglobin 13.1 12.0 - 16.0 g/dL   HCT 39.7 35.0 - 47.0 %   MCV 90.3 80.0 - 100.0 fL   MCH 29.7 26.0 - 34.0 pg   MCHC 32.9 32.0 - 36.0 g/dL   RDW 13.6 11.5 - 14.5 %   Platelets 216 150 - 440 K/uL   Neutrophils Relative % 52 %   Neutro Abs 2.4 1.4 - 6.5 K/uL   Lymphocytes Relative 35 %   Lymphs Abs 1.6 1.0 - 3.6 K/uL   Monocytes Relative 9 %   Monocytes Absolute 0.4 0.2 - 0.9 K/uL   Eosinophils Relative 3 %   Eosinophils Absolute 0.1 0 - 0.7 K/uL    Basophils Relative 1 %   Basophils Absolute 0.0 0 - 0.1 K/uL  Basic metabolic panel     Status: Abnormal   Collection Time: 02/27/15 12:54 PM  Result Value Ref Range   Sodium 139 135 - 145 mmol/L   Potassium 3.8 3.5 - 5.1 mmol/L   Chloride 105 101 - 111 mmol/L   CO2 31 22 - 32 mmol/L   Glucose, Bld 98 65 - 99 mg/dL   BUN 12 6 - 20 mg/dL   Creatinine, Ser 0.63 0.44 - 1.00 mg/dL   Calcium 9.3 8.9 - 10.3 mg/dL   GFR calc non Af Amer >60 >60 mL/min   GFR calc Af Amer >60 >60 mL/min    Comment: (NOTE) The eGFR has been calculated using the CKD EPI equation. This calculation has not been validated in all clinical situations. eGFR's persistently <60 mL/min signify possible Chronic Kidney Disease.    Anion gap 3 (L) 5 - 15  Troponin I     Status: None   Collection Time: 02/27/15 12:54 PM  Result Value Ref Range   Troponin I <0.03 <0.031 ng/mL    Comment:        NO INDICATION OF MYOCARDIAL INJURY.      PHQ2/9: Depression screen [facility] [address] 03/10/2015 12/24/2014  Decreased Interest 0 0  Down, Depressed, Hopeless 0 0  PHQ - 2 Score 0 0    Fall Risk: Fall Risk  03/10/2015 12/24/2014  Falls in the past year? No No    Functional Status Survey: Is the patient deaf or have  difficulty hearing?: No Does the patient have difficulty seeing, even when wearing glasses/contacts?: Yes (glasses) Does the patient have difficulty concentrating, remembering, or making decisions?: No Does the patient have difficulty walking or climbing stairs?: No Does the patient have difficulty dressing or bathing?: No Does the patient have difficulty doing errands alone such as visiting a doctor's office or shopping?: No    Assessment & Plan  1. Osteopenia  Discussed medication but she wants to hold off for now, she will try changing her diet and increase exercise  2. Chronic gastric erosion  - ranitidine (ZANTAC) 300 MG capsule; Take 1 capsule (300 mg total) by mouth daily as needed for heartburn.   Dispense: 30 capsule; Refill: 2  3. Gastroesophageal reflux disease without esophagitis  We will try weaning off Omeprazole and try Zantac - ranitidine (ZANTAC) 300 MG capsule; Take 1 capsule (300 mg total) by mouth daily as needed for heartburn.  Dispense: 30 capsule; Refill: 2  4. Primary osteoarthritis involving multiple joints  Continue prn medication

## 2015-03-20 ENCOUNTER — Encounter: Payer: Self-pay | Admitting: Family Medicine

## 2015-03-23 ENCOUNTER — Encounter: Payer: Self-pay | Admitting: Family Medicine

## 2015-05-14 ENCOUNTER — Encounter: Payer: Self-pay | Admitting: Family Medicine

## 2015-05-14 ENCOUNTER — Ambulatory Visit (INDEPENDENT_AMBULATORY_CARE_PROVIDER_SITE_OTHER): Payer: BC Managed Care – PPO | Admitting: Family Medicine

## 2015-05-14 VITALS — BP 128/78 | HR 99 | Temp 98.2°F | Resp 18 | Ht 65.0 in | Wt 132.8 lb

## 2015-05-14 DIAGNOSIS — M545 Low back pain, unspecified: Secondary | ICD-10-CM

## 2015-05-14 MED ORDER — DICLOFENAC SODIUM 1 % TD GEL: 4.0000 g | Freq: Four times a day (QID) | TRANSDERMAL | Status: DC

## 2015-05-14 MED ORDER — METAXALONE 800 MG PO TABS: 800.0000 mg | ORAL_TABLET | Freq: Three times a day (TID) | ORAL | Status: DC

## 2015-05-14 NOTE — Progress Notes (Signed)
Name: Dana Arnold   MRN: [id number]    DOB: 04/28/1950   Date:05/14/2015       Progress Note  Subjective  Chief Complaint  Chief Complaint  Patient presents with  . Back Pain    Patient has DDD but states in the past 1 and half week her back has been more achy and would like x-rays.    HPI  Low Back pain: she has a long history of DDD of right lower back, no radiculitis. She states she has been diagnosed with DDD lumbar spine. She states that for past couple of weeks she has noticed worsening of symptoms. She gets monthly massage therapy. She tried using Voltaren gel but only once and it helped with symptoms. Many years ago, she saw Dr. Oswaldo Milian ( chiropractor ) but not recently.  No tingling, numbness or weakness, no bowel or bladder incontinence. Pain is described as soreness, aching.  Patient Active Problem List   Diagnosis Date Noted  . Chondromalacia 03/10/2015  . Arthritis, degenerative 12/24/2014  . Hyperglycemia 12/24/2014  . Cervical radiculitis 12/24/2014  . Subclinical hypothyroidism 12/24/2014  . Vitamin D deficiency 12/24/2014  . Hx of Clostridium difficile infection 12/24/2014  . External hemorrhoid 12/24/2014  . Chronic gastric erosion 12/24/2014  . Chondrocalcinosis of right knee 12/24/2014  . Osteopenia 12/24/2014  . DDD (degenerative disc disease), lumbar 12/24/2014  . Need for shingles vaccine 12/24/2014    Past Surgical History  Procedure Laterality Date  . Arthroscopic knee Right 05/26/2014  . Right ulna surgery Right 07/01/2010  . Tonsillectomy and adenoidectomy Bilateral 1961  . Cholecystectomy  02/1989    Family History  Problem Relation Age of Onset  . Osteoporosis Mother   . Aneurysm Father     Social History   Social History  . Marital Status: Married    Spouse Name: N/A  . Number of Children: N/A  . Years of Education: N/A   Occupational History  . Not on file.   Social History Main Topics  . Smoking status: Never Smoker   .  Smokeless tobacco: Never Used  . Alcohol Use: No  . Drug Use: No  . Sexual Activity: Yes    Birth Control/ Protection: Post-menopausal   Other Topics Concern  . Not on file   Social History Narrative     Current outpatient prescriptions:  .  cholecalciferol (VITAMIN D) 1000 UNITS tablet, Take 2,000 Units by mouth daily., Disp: , Rfl:  .  Lactobacillus (PROBIOTIC ACIDOPHILUS PO), Take by mouth. One tablespoon daily, Disp: , Rfl:  .  magnesium 30 MG tablet, Take 100 mg by mouth daily. , Disp: , Rfl:  .  diclofenac sodium (VOLTAREN) 1 % GEL, Apply 4 g topically 4 (four) times daily., Disp: 100 g, Rfl: 0 .  metaxalone (SKELAXIN) 800 MG tablet, Take 1 tablet (800 mg total) by mouth 3 (three) times daily., Disp: 30 tablet, Rfl: 0  No Known Allergies   ROS  Ten systems reviewed and is negative except as mentioned in HPI   Objective  Filed Vitals:   05/14/15 1017  BP: 128/78  Pulse: 99  Temp: 98.2 F (36.8 C)  TempSrc: Oral  Resp: 18  Height: _0  (1.651 m)  Weight: 132 lb 12.8 oz (60.238 kg)  SpO2: 99%    Body mass index is 22.1 kg/(m^2).  Physical Exam  Constitutional: Patient appears well-developed and well-nourished.  No distress.  HEENT: head atraumatic, normocephalic, pupils equal and reactive to light,  neck supple,  throat within normal limits Cardiovascular: Normal rate, regular rhythm and normal heart sounds.  No murmur heard. No BLE edema. Pulmonary/Chest: Effort normal and breath sounds normal. No respiratory distress. Abdominal: Soft.  There is no tenderness. Psychiatric: Patient has a normal mood and affect. behavior is normal. Judgment and thought content normal. Muscular Skeletal: pain during palpation of right lower back ( spasms ), negative straight leg raise, patella tendon is normal   Recent Results (from the past 2160 hour(s))  CBC with Differential     Status: None   Collection Time: 02/27/15 12:54 PM  Result Value Ref Range   WBC 4.5 3.6 - 11.0  K/uL   RBC 4.39 3.80 - 5.20 MIL/uL   Hemoglobin 13.1 12.0 - 16.0 g/dL   HCT 39.7 35.0 - 47.0 %   MCV 90.3 80.0 - 100.0 fL   MCH 29.7 26.0 - 34.0 pg   MCHC 32.9 32.0 - 36.0 g/dL   RDW 13.6 11.5 - 14.5 %   Platelets 216 150 - 440 K/uL   Neutrophils Relative % 52 %   Neutro Abs 2.4 1.4 - 6.5 K/uL   Lymphocytes Relative 35 %   Lymphs Abs 1.6 1.0 - 3.6 K/uL   Monocytes Relative 9 %   Monocytes Absolute 0.4 0.2 - 0.9 K/uL   Eosinophils Relative 3 %   Eosinophils Absolute 0.1 0 - 0.7 K/uL   Basophils Relative 1 %   Basophils Absolute 0.0 0 - 0.1 K/uL  Basic metabolic panel     Status: Abnormal   Collection Time: 02/27/15 12:54 PM  Result Value Ref Range   Sodium 139 135 - 145 mmol/L   Potassium 3.8 3.5 - 5.1 mmol/L   Chloride 105 101 - 111 mmol/L   CO2 31 22 - 32 mmol/L   Glucose, Bld 98 65 - 99 mg/dL   BUN 12 6 - 20 mg/dL   Creatinine, Ser 0.63 0.44 - 1.00 mg/dL   Calcium 9.3 8.9 - 10.3 mg/dL   GFR calc non Af Amer >60 >60 mL/min   GFR calc Af Amer >60 >60 mL/min    Comment: (NOTE) The eGFR has been calculated using the CKD EPI equation. This calculation has not been validated in all clinical situations. eGFR's persistently <60 mL/min signify possible Chronic Kidney Disease.    Anion gap 3 (L) 5 - 15  Troponin I     Status: None   Collection Time: 02/27/15 12:54 PM  Result Value Ref Range   Troponin I <0.03 <0.031 ng/mL    Comment:        NO INDICATION OF MYOCARDIAL INJURY.     PHQ2/9: Depression screen [facility] 2/9 05/14/2015 03/10/2015 12/24/2014  Decreased Interest 0 0 0  Down, Depressed, Hopeless 0 0 0  PHQ - 2 Score 0 0 0     Fall Risk: Fall Risk  05/14/2015 03/10/2015 12/24/2014  Falls in the past year? No No No     Functional Status Survey: Is the patient deaf or have difficulty hearing?: No Does the patient have difficulty seeing, even when wearing glasses/contacts?: No Does the patient have difficulty concentrating, remembering, or making decisions?:  No Does the patient have difficulty walking or climbing stairs?: No Does the patient have difficulty dressing or bathing?: No Does the patient have difficulty doing errands alone such as visiting a doctor's office or shopping?: No    Assessment & Plan  1. Right-sided low back pain without sciatica  Advised to continue massage therapy, also needs to  consider resuming PT, she is doing home exercises - DG Lumbar Spine Complete; Future - metaxalone (SKELAXIN) 800 MG tablet; Take 1 tablet (800 mg total) by mouth 3 (three) times daily.  Dispense: 30 tablet; Refill: 0 - diclofenac sodium (VOLTAREN) 1 % GEL; Apply 4 g topically 4 (four) times daily.  Dispense: 100 g; Refill: 0

## 2015-05-15 ENCOUNTER — Encounter: Payer: Self-pay | Admitting: Family Medicine

## 2015-05-19 ENCOUNTER — Ambulatory Visit: Payer: BC Managed Care – PPO | Admitting: Family Medicine

## 2015-05-22 ENCOUNTER — Ambulatory Visit (INDEPENDENT_AMBULATORY_CARE_PROVIDER_SITE_OTHER): Payer: BC Managed Care – PPO

## 2015-05-22 DIAGNOSIS — Z23 Encounter for immunization: Secondary | ICD-10-CM

## 2015-07-24 ENCOUNTER — Encounter: Payer: Self-pay | Admitting: Family Medicine

## 2015-09-07 ENCOUNTER — Other Ambulatory Visit: Payer: Self-pay | Admitting: Family Medicine

## 2015-09-07 ENCOUNTER — Ambulatory Visit
Admission: RE | Admit: 2015-09-07 | Discharge: 2015-09-07 | Disposition: A | Discharge status: Home / Self Care | Payer: BC Managed Care – PPO | Source: Ambulatory Visit | Attending: Family Medicine | Admitting: Family Medicine

## 2015-09-07 DIAGNOSIS — M5136 Other intervertebral disc degeneration, lumbar region: Secondary | ICD-10-CM | POA: Diagnosis not present

## 2015-09-07 DIAGNOSIS — M545 Low back pain, unspecified: Secondary | ICD-10-CM

## 2015-09-07 DIAGNOSIS — M419 Scoliosis, unspecified: Secondary | ICD-10-CM | POA: Insufficient documentation

## 2015-09-08 ENCOUNTER — Encounter: Payer: Self-pay | Admitting: Family Medicine

## 2015-09-08 DIAGNOSIS — M419 Scoliosis, unspecified: Secondary | ICD-10-CM | POA: Insufficient documentation

## 2015-11-10 ENCOUNTER — Ambulatory Visit: Payer: BC Managed Care – PPO | Admitting: Family Medicine

## 2016-02-04 LAB — BASIC METABOLIC PANEL
BUN: 14 mg/dL (ref 4–21)
Potassium: 4.2 mmol/L (ref 3.4–5.3)
Sodium: 143 mmol/L (ref 137–147)

## 2016-02-04 LAB — TSH
TSH: 4.66 u[IU]/mL (ref 0.41–5.90)
TSH: 4.66 u[IU]/mL (ref 0.41–5.90)

## 2016-02-04 LAB — HEPATIC FUNCTION PANEL
ALT: 15 U/L (ref 7–35)
AST: 25 U/L (ref 13–35)
Alkaline Phosphatase: 55 U/L (ref 25–125)

## 2016-02-04 LAB — LIPID PANEL
Cholesterol: 199 mg/dL (ref 0–200)
HDL: 81 mg/dL — AB (ref 35–70)
LDL Cholesterol: 107 mg/dL
Triglycerides: 56 mg/dL (ref 40–160)

## 2016-02-04 LAB — HM MAMMOGRAPHY: HM Mammogram: NORMAL (ref 0–4)

## 2016-02-04 LAB — HEMOGLOBIN A1C: Hemoglobin A1C: 5.5

## 2016-02-15 ENCOUNTER — Encounter: Payer: Self-pay | Admitting: Family Medicine

## 2016-02-15 LAB — HM PAP SMEAR: HM Pap smear: NORMAL

## 2016-04-22 ENCOUNTER — Encounter: Payer: Self-pay | Admitting: Family Medicine

## 2016-04-22 ENCOUNTER — Ambulatory Visit (INDEPENDENT_AMBULATORY_CARE_PROVIDER_SITE_OTHER): Payer: BC Managed Care – PPO | Admitting: Family Medicine

## 2016-04-22 VITALS — BP 116/64 | HR 88 | Temp 98.2°F | Resp 18 | Ht 65.0 in | Wt 134.2 lb

## 2016-04-22 DIAGNOSIS — Z23 Encounter for immunization: Secondary | ICD-10-CM

## 2016-04-22 DIAGNOSIS — R7989 Other specified abnormal findings of blood chemistry: Secondary | ICD-10-CM

## 2016-04-22 DIAGNOSIS — M419 Scoliosis, unspecified: Secondary | ICD-10-CM | POA: Diagnosis not present

## 2016-04-22 DIAGNOSIS — M5030 Other cervical disc degeneration, unspecified cervical region: Secondary | ICD-10-CM

## 2016-04-22 DIAGNOSIS — R946 Abnormal results of thyroid function studies: Secondary | ICD-10-CM

## 2016-04-22 DIAGNOSIS — M5136 Other intervertebral disc degeneration, lumbar region: Secondary | ICD-10-CM | POA: Diagnosis not present

## 2016-04-22 DIAGNOSIS — S99922A Unspecified injury of left foot, initial encounter: Secondary | ICD-10-CM

## 2016-04-22 NOTE — Progress Notes (Signed)
Name: Dana Arnold   MRN: [id number]    DOB: 12/08/1950   Date:04/22/2016       Progress Note  Subjective  Chief Complaint  Chief Complaint  Patient presents with  . Foot Pain    Left Foot soreness, patient states she drop something heavy on her foot and second and third toe is now discolored. Patient has been taking 2 Ibuprofen and Ice for pain.   . Back Pain    Goes to massage therapy for her back pain and cervical pain for degenerative disc disease    HPI  Toe injury: she states she dropped paper on top of left foot, and third toe got painful, not much swelling, she iced and took ibuprofen and is doing better, just has small bruiting, normal rom  Low back pain: she has mild scoliosis and DDD lumbar spine, she has massage therapy once or twice a massage therapy to control symptoms. No radiculitis at this time. Pain is described as aching, intermittent. Pain right now is 0/10. She had PT in the past and still practices her stretching exrcises  Neck pain: she has DDD, not currently having tingling or numbness, it swells at times and radiates to nuchal area. She states pain is also intermittent, aching like and at times shooting. Average pain is 4/10  Subclinical hypothyroidism: had TSH done at GYN and TSH is elevated we will recheck levels. She has mild constipation, controlled with probiotic and magnesium, she has mild dry skin, but no hair loss.   Patient Active Problem List   Diagnosis Date Noted  . Scoliosis of lumbar spine 04/22/2016  . Thoracic scoliosis 09/08/2015  . Chondromalacia 03/10/2015  . Arthritis, degenerative 12/24/2014  . Hyperglycemia 12/24/2014  . Cervical radiculitis 12/24/2014  . Subclinical hypothyroidism 12/24/2014  . Vitamin D deficiency 12/24/2014  . Hx of Clostridium difficile infection 12/24/2014  . External hemorrhoid 12/24/2014  . Chronic gastric erosion 12/24/2014  . Chondrocalcinosis of right knee 12/24/2014  . Osteopenia 12/24/2014  . DDD  (degenerative disc disease), lumbar 12/24/2014  . Need for shingles vaccine 12/24/2014    Past Surgical History:  Procedure Laterality Date  . arthroscopic knee Right 05/26/2014  . CHOLECYSTECTOMY  02/1989  . right ulna surgery Right 07/01/2010  . TONSILLECTOMY AND ADENOIDECTOMY Bilateral 1961    Family History  Problem Relation Age of Onset  . Osteoporosis Mother   . Aneurysm Father     Social History   Social History  . Marital status: Married    Spouse name: N/A  . Number of children: N/A  . Years of education: N/A   Occupational History  . Not on file.   Social History Main Topics  . Smoking status: Never Smoker  . Smokeless tobacco: Never Used  . Alcohol use No  . Drug use: No  . Sexual activity: Yes    Birth control/ protection: Post-menopausal   Other Topics Concern  . Not on file   Social History Narrative  . No narrative on file     Current Outpatient Prescriptions:  .  Cholecalciferol (VITAMIN D) 2000 units tablet, Take 2,000 Units by mouth daily., Disp: , Rfl:  .  Cyanocobalamin (B-12-SL) 1000 MCG SUBL, Place 1 tablet under the tongue at bedtime., Disp: , Rfl:  .  diclofenac sodium (VOLTAREN) 1 % GEL, Apply 4 g topically 4 (four) times daily., Disp: 100 g, Rfl: 0 .  Lactobacillus (PROBIOTIC ACIDOPHILUS PO), Take by mouth. One tablespoon daily, Disp: , Rfl:  .  magnesium oxide (MAG-OX) 400 MG tablet, Take 400 mg by mouth daily., Disp: , Rfl:   No Known Allergies   ROS  Constitutional: Negative for fever or weight change.  Respiratory: Negative for cough and shortness of breath.   Cardiovascular: Negative for chest pain or palpitations.  Gastrointestinal: Negative for abdominal pain, no bowel changes.  Musculoskeletal: Negative for gait problem or joint swelling.  Skin: Negative for rash.  Neurological: Negative for dizziness or headache.  No other specific complaints in a complete review of systems (except as listed in HPI  above).  Objective  Vitals:   04/22/16 1051  BP: 116/64  Pulse: 88  Resp: 18  Temp: 98.2 F (36.8 C)  TempSrc: Oral  SpO2: 97%  Weight: 134 lb 3.2 oz (60.9 kg)  Height: 5\' 5"  (1.651 m)    Body mass index is 22.33 kg/m.  Physical Exam  Constitutional: Patient appears well-developed and well-nourished. No distress.  HEENT: head atraumatic, normocephalic, pupils equal and reactive to light, she has decrease rom of neck, throat within normal limits Cardiovascular: Normal rate, regular rhythm and normal heart sounds.  No murmur heard. No BLE edema. Pulmonary/Chest: Effort normal and breath sounds normal. No respiratory distress. Abdominal: Soft.  There is no tenderness. Psychiatric: Patient has a normal mood and affect. behavior is normal. Judgment and thought content normal. Muscular Skeletal: normal ROM of lumbar spine, no pain during palpation of lumbar spine   Recent Results (from the past 2160 hour(s))  Basic metabolic panel     Status: None   Collection Time: 02/04/16 12:00 AM  Result Value Ref Range   BUN 14 4 - 21 mg/dL   Potassium 4.2 3.4 - 5.3 mmol/L   Sodium 143 137 - 147 mmol/L  Lipid panel     Status: Abnormal   Collection Time: 02/04/16 12:00 AM  Result Value Ref Range   Triglycerides 56 40 - 160 mg/dL   Cholesterol 161 0 - 096 mg/dL   HDL 81 (A) 35 - 70 mg/dL   LDL Cholesterol 045 mg/dL  Hepatic function panel     Status: None   Collection Time: 02/04/16 12:00 AM  Result Value Ref Range   Alkaline Phosphatase 55 25 - 125 U/L   ALT 15 7 - 35 U/L   AST 25 13 - 35 U/L  Hemoglobin A1c     Status: None   Collection Time: 02/04/16 12:00 AM  Result Value Ref Range   Hemoglobin A1C 5.5   TSH     Status: None   Collection Time: 02/04/16 12:00 AM  Result Value Ref Range   TSH 4.66 0.41 - 5.90 uIU/mL  HM MAMMOGRAPHY     Status: None   Collection Time: 02/04/16 12:00 AM  Result Value Ref Range   HM Mammogram Self Reported Normal 0-4 Bi-Rad, Self Reported  Normal    Comment: [facility]  TSH     Status: None   Collection Time: 02/04/16 12:00 AM  Result Value Ref Range   TSH 4.66 0.41 - 5.90 uIU/mL  HM PAP SMEAR     Status: None   Collection Time: 02/15/16 12:00 AM  Result Value Ref Range   HM Pap smear Normal     Comment: Dr. Henderson Cloud, MD     PHQ2/9: Depression screen [facility] 2/9 04/22/2016 05/14/2015 03/10/2015 12/24/2014  Decreased Interest 0 0 0 0  Down, Depressed, Hopeless 0 0 0 0  PHQ - 2 Score 0 0 0 0     Fall  Risk: Fall Risk  04/22/2016 05/14/2015 03/10/2015 12/24/2014  Falls in the past year? No No No No     Functional Status Survey: Is the patient deaf or have difficulty hearing?: No Does the patient have difficulty seeing, even when wearing glasses/contacts?: No Does the patient have difficulty concentrating, remembering, or making decisions?: No Does the patient have difficulty walking or climbing stairs?: No Does the patient have difficulty dressing or bathing?: No Does the patient have difficulty doing errands alone such as visiting a doctor's office or shopping?: No    Assessment & Plan  1. Elevated TSH  - Thyroid Panel With TSH  2. Needs flu shot  refused  3. DDD (degenerative disc disease), lumbar  Continue massage therapy   4. DDD (degenerative disc disease), cervical  continue massage therapy   5. Scoliosis of lumbar spine, unspecified scoliosis type  stable  6. Injury of toe on left foot, initial encounter  Advised to body tape toes

## 2016-04-23 LAB — THYROID PANEL WITH TSH
Free Thyroxine Index: 2.4 (ref 1.4–3.8)
T3 Uptake: 28 % (ref 22–35)
T4, Total: 8.5 ug/dL (ref 4.5–12.0)
TSH: 3.29 mIU/L

## 2016-04-25 ENCOUNTER — Encounter: Payer: Self-pay | Admitting: Family Medicine

## 2016-08-02 ENCOUNTER — Encounter: Payer: Self-pay | Admitting: Family Medicine

## 2016-08-02 ENCOUNTER — Ambulatory Visit (INDEPENDENT_AMBULATORY_CARE_PROVIDER_SITE_OTHER): Payer: BC Managed Care – PPO | Admitting: Family Medicine

## 2016-08-02 VITALS — BP 118/64 | HR 94 | Temp 98.2°F | Resp 16 | Ht 65.0 in | Wt 129.4 lb

## 2016-08-02 DIAGNOSIS — M8580 Other specified disorders of bone density and structure, unspecified site: Secondary | ICD-10-CM | POA: Diagnosis not present

## 2016-08-02 DIAGNOSIS — M5136 Other intervertebral disc degeneration, lumbar region: Secondary | ICD-10-CM

## 2016-08-02 DIAGNOSIS — M419 Scoliosis, unspecified: Secondary | ICD-10-CM | POA: Diagnosis not present

## 2016-08-02 NOTE — Progress Notes (Addendum)
Name: Dana Arnold   MRN: [id number]    DOB: 05/11/1950   Date:08/02/2016       Progress Note  Subjective  Chief Complaint  Chief Complaint  Patient presents with  . Back Pain    lower pt has degenerative discs in her back has used ice and heat for relief          HPI  Pt presents with c/o low back pain x6 days. 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  She went to her massage therapist 4 days ago and it helped a little bit, but she is still having significant pain. She does "deep stretches" daily provided to her by a physical therapist in the past and has been icing her back daily.  Notes has history of degenerative disc disease. Has applied the Voltaren gel once and it did help with her pain.  No loss of bowel/bladder control, no saddle anesthesia, no numbness/tingling in legs or arms, no weakness.  Has Lumbar Xray from 09/07/2015 that finds: FINDINGS: Mild rightward scoliosis. No fracture. Disc spaces are maintained. Degenerative facet disease throughout the lumbar spine. Prior cholecystectomy. IMPRESSION: Mild degenerative facet disease and rightward scoliosis. No acute findings. Electronically Signed   By: Charlett Nose M.D.   On: 09/07/2015 15:41  She would like an MRI and referral to ortho. Pt also has notable history of Scoliosis of Lumbar and thoracic spines and osteopenia.  Patient Active Problem List   Diagnosis Date Noted  . Scoliosis of lumbar spine 04/22/2016  . Thoracic scoliosis 09/08/2015  . Chondromalacia 03/10/2015  . Arthritis, degenerative 12/24/2014  . Hyperglycemia 12/24/2014  . Cervical radiculitis 12/24/2014  . Subclinical hypothyroidism 12/24/2014  . Vitamin D deficiency 12/24/2014  . Hx of Clostridium difficile infection 12/24/2014  . External hemorrhoid 12/24/2014  . Chronic gastric erosion 12/24/2014  . Chondrocalcinosis of right knee 12/24/2014   . Osteopenia 12/24/2014  . DDD (degenerative disc disease), lumbar 12/24/2014  . Need for shingles vaccine 12/24/2014    Social History  Substance Use Topics  . Smoking status: Never Smoker  . Smokeless tobacco: Never Used  . Alcohol use No     Current Outpatient Prescriptions:  .  Cholecalciferol (VITAMIN D) 2000 units tablet, Take 2,000 Units by mouth daily., Disp: , Rfl:  .  Cyanocobalamin (B-12-SL) 1000 MCG SUBL, Place 1 tablet under the tongue at bedtime., Disp: , Rfl:  .  diclofenac sodium (VOLTAREN) 1 % GEL, Apply 4 g topically 4 (four) times daily., Disp: 100 g, Rfl: 0 .  Lactobacillus (PROBIOTIC ACIDOPHILUS PO), Take by mouth. One tablespoon daily, Disp: , Rfl:  .  magnesium oxide (MAG-OX) 400 MG tablet, Take 400 mg by mouth daily., Disp: , Rfl:   No Known Allergies  ROS  Ten systems reviewed and is negative except as mentioned in HPI  Objective  Vitals:   08/02/16 0839  BP: 118/64  Pulse: 94  Resp: 16  Temp: 98.2 F (36.8 C)  SpO2: 97%  Weight: 129 lb 6 oz (58.7 kg)  Height: 5\' 5"  (1.651 m)    Body mass index is 21.53 kg/m.  Nursing Note and Vital Signs reviewed.  Physical Exam   Constitutional: Patient appears well-developed and well-nourished. No distress.  HEENT: head atraumatic, normocephalic Cardiovascular: Normal rate, regular rhythm, S1/S2 present.  No murmur or rub heard. No BLE edema. Pulmonary/Chest: Effort normal and breath sounds clear. No  respiratory distress or retractions. Abdominal: Soft and non-tender, bowel sounds present x4 quadrants. MSK: Limited AROM of lumbar and thoracic spine. Slightly antalgic gait. Strength equal bilaterally, pulses intact bilaterally. Psychiatric: Patient has a normal mood and affect. behavior is normal. Judgment and thought content normal.  No results found for this or any previous visit (from the past 2160 hour(s)).  Assessment & Plan  1. DDD (degenerative disc disease), lumbar 2. Scoliosis of  lumbar spine, unspecified scoliosis type 3. Osteopenia, unspecified location 4. Scoliosis of thoracic spine, unspecified scoliosis type - AMB referral to orthopedics  -Red flags and when to present for emergency care or RTC including fever >101.33F, chest pain, shortness of breath, new/worsening/un-resolving symptoms, saddle anesthesia, or weakness reviewed with patient at time of visit. Follow up and care instructions discussed and provided in AVS.  I have reviewed this encounter including the documentation in this note and/or discussed this patient with the Deboraha Sprang, FNP, NP-C. I am certifying that I agree with the content of this note as supervising physician.  Alba Cory, MD [facility] 08/07/2016, 8:19 PM

## 2017-01-31 ENCOUNTER — Ambulatory Visit: Payer: BC Managed Care – PPO | Admitting: Family Medicine

## 2017-01-31 ENCOUNTER — Encounter: Payer: Self-pay | Admitting: Family Medicine

## 2017-01-31 VITALS — BP 126/78 | HR 98 | Temp 98.0°F | Resp 16 | Ht 65.0 in | Wt 133.3 lb

## 2017-01-31 DIAGNOSIS — L739 Follicular disorder, unspecified: Secondary | ICD-10-CM

## 2017-01-31 NOTE — Progress Notes (Signed)
Name: Dana Arnold   MRN: [id number]    DOB: 07-22-1950   Date:01/31/2017       Progress Note  Subjective  Chief Complaint  Chief Complaint  Patient presents with  . Rash    patient stated that she noticed on Sunday a bump on her bottom. she just had an appt with Dr. Cheree DittoGraham (dematologist). patient stated that she has pain in her lower back so she is not sure. she stated that she had some pain in her left hip that woke her up, but the area of concern is on the right side. patient had tried some otc neosporin and covered it with a bandaid.    HPI  Patient presents with complaint of new onset of skin issue: Saw Dr. Cheree DittoGraham 01/26/2017 for a full body skin survey and did not have any issues.  On 01/28/2017 she noticed an area on her RIGHT buttock and felt a "rough patch" - says her husband looked at it and said it looked like a pimple. Patient is concerned that is could be shingles. She has had Shingles vaccine several years ago.  Never had active shingles.  She always has some low back pain that is intermittent - states she did not notice any additional or different pain.  Patient Active Problem List   Diagnosis Date Noted  . Scoliosis of lumbar spine 04/22/2016  . Thoracic scoliosis 09/08/2015  . Chondromalacia 03/10/2015  . Arthritis, degenerative 12/24/2014  . Hyperglycemia 12/24/2014  . Cervical radiculitis 12/24/2014  . Subclinical hypothyroidism 12/24/2014  . Vitamin D deficiency 12/24/2014  . Hx of Clostridium difficile infection 12/24/2014  . External hemorrhoid 12/24/2014  . Chronic gastric erosion 12/24/2014  . Chondrocalcinosis of right knee 12/24/2014  . Osteopenia 12/24/2014  . DDD (degenerative disc disease), lumbar 12/24/2014  . Need for shingles vaccine 12/24/2014    Social History   Tobacco Use  . Smoking status: Never Smoker  . Smokeless tobacco: Never Used  Substance Use Topics  . Alcohol use: No    Alcohol/week: 0.0 oz     Current Outpatient Medications:   .  Cholecalciferol (VITAMIN D) 2000 units tablet, Take 2,000 Units by mouth daily., Disp: , Rfl:  .  Cyanocobalamin (B-12-SL) 1000 MCG SUBL, Place 1 tablet under the tongue at bedtime., Disp: , Rfl:  .  Lactobacillus (PROBIOTIC ACIDOPHILUS PO), Take by mouth. One tablespoon daily, Disp: , Rfl:  .  magnesium oxide (MAG-OX) 400 MG tablet, Take 400 mg by mouth daily., Disp: , Rfl:  .  diclofenac sodium (VOLTAREN) 1 % GEL, Apply 4 g topically 4 (four) times daily. (Patient not taking: Reported on 01/31/2017), Disp: 100 g, Rfl: 0 .  mometasone (ELOCON) 0.1 % cream, , Disp: , Rfl:   No Known Allergies  ROS  Constitutional: Negative for fever or weight change.  Respiratory: Negative for cough and shortness of breath.   Cardiovascular: Negative for chest pain or palpitations.  Gastrointestinal: Negative for abdominal pain, no bowel changes.  Musculoskeletal: Negative for gait problem or joint swelling.  Skin: See HPI  Neurological: Negative for dizziness or headache.  No other specific complaints in a complete review of systems (except as listed in HPI above).  Objective  Vitals:   01/31/17 1111  BP: 126/78  Pulse: 98  Resp: 16  Temp: 98 F (36.7 C)  TempSrc: Oral  SpO2: 99%  Weight: 133 lb 4.8 oz (60.5 kg)  Height: 5\' 5"  (1.651 m)   Body mass index is 22.18 kg/m.  Nursing Note and Vital Signs reviewed.  Physical Exam  Constitutional: Patient appears well-developed and well-nourished.  No distress.  HEENT: head atraumatic, normocephalic Cardiovascular: Normal rate, regular rhythm, S1/S2 present.  No murmur or rub heard. No BLE edema. Pulmonary/Chest: Effort normal and breath sounds clear. No respiratory distress or retractions. Psychiatric: Patient has a normal mood and affect. behavior is normal. Judgment and thought content normal. Skin: 0.5cm scab with very small ring of surrounging erythema; no fluctuance or induration, no drainage, appears to be healing.  No results  found for this or any previous visit (from the past 2160 hour(s)).   Assessment & Plan  1. Folliculitis - Advised this lesion is likely folliculitis, recommend OTC bacitracin PRN.  Monitor daily for increased redness or swelling. - Warm compresses and home care per AVS.

## 2017-01-31 NOTE — Patient Instructions (Addendum)

## 2017-03-23 ENCOUNTER — Other Ambulatory Visit: Payer: Self-pay | Admitting: Student

## 2017-03-23 DIAGNOSIS — R1310 Dysphagia, unspecified: Secondary | ICD-10-CM

## 2017-03-31 ENCOUNTER — Ambulatory Visit: Payer: BC Managed Care – PPO

## 2017-04-07 ENCOUNTER — Ambulatory Visit
Admission: RE | Admit: 2017-04-07 | Discharge: 2017-04-07 | Disposition: A | Discharge status: Home / Self Care | Payer: BC Managed Care – PPO | Source: Ambulatory Visit | Attending: Student | Admitting: Student

## 2017-04-07 ENCOUNTER — Encounter: Payer: Self-pay | Admitting: Student

## 2017-04-07 DIAGNOSIS — R1310 Dysphagia, unspecified: Secondary | ICD-10-CM | POA: Diagnosis not present

## 2017-11-06 ENCOUNTER — Ambulatory Visit: Payer: BC Managed Care – PPO | Admitting: Family Medicine

## 2017-12-13 ENCOUNTER — Encounter: Payer: Self-pay | Admitting: Nurse Practitioner

## 2017-12-13 ENCOUNTER — Ambulatory Visit: Payer: BC Managed Care – PPO | Admitting: Nurse Practitioner

## 2017-12-13 VITALS — BP 124/76 | HR 81 | Temp 98.2°F | Resp 12 | Ht 64.5 in | Wt 134.1 lb

## 2017-12-13 DIAGNOSIS — H01001 Unspecified blepharitis right upper eyelid: Secondary | ICD-10-CM

## 2017-12-13 NOTE — Progress Notes (Signed)
Name: Dana Arnold   MRN: [id number]    DOB: 15-Aug-1950   Date:12/13/2017       Progress Note  Subjective  Chief Complaint  Chief Complaint  Patient presents with  . Eye Problem    onset 1 day rt eyelid seollen, red and painful    HPI  Patient noticed right upper eyelid red swollen No new make up, or old make up, doesn't wear contacts. Does note some allergies and sinus congestion going on since season changing.  No vision changes, states had an eye pain for just a second but resolved no pain, irritation in eye, no pain with eye movements, no discharge, fevers.   Patient Active Problem List   Diagnosis Date Noted  . Scoliosis of lumbar spine 04/22/2016  . Thoracic scoliosis 09/08/2015  . Chondromalacia 03/10/2015  . Arthritis, degenerative 12/24/2014  . Hyperglycemia 12/24/2014  . Cervical radiculitis 12/24/2014  . Subclinical hypothyroidism 12/24/2014  . Vitamin D deficiency 12/24/2014  . Hx of Clostridium difficile infection 12/24/2014  . External hemorrhoid 12/24/2014  . Chronic gastric erosion 12/24/2014  . Chondrocalcinosis of right knee 12/24/2014  . Osteopenia 12/24/2014  . DDD (degenerative disc disease), lumbar 12/24/2014  . Need for shingles vaccine 12/24/2014    Past Medical History:  Diagnosis Date  . Cervical radiculitis   . Cervicalgia   . Chondromalacia   . Chronic constipation   . Degeneration of lumbar or lumbosacral intervertebral disc   . External hemorrhoid, thrombosed   . Gastritis, chronic   . GERD (gastroesophageal reflux disease)   . Hyperglycemia   . Hyperlipidemia   . Lupus anticoagulant positive   . Olecranon bursitis   . Osteopenia   . Ovarian failure   . Paresthesia   . Vitamin D deficiency     Past Surgical History:  Procedure Laterality Date  . arthroscopic knee Right 05/26/2014  . CHOLECYSTECTOMY  02/1989  . right ulna surgery Right 07/01/2010  . TONSILLECTOMY AND ADENOIDECTOMY Bilateral 1961    Social History    Tobacco Use  . Smoking status: Never Smoker  . Smokeless tobacco: Never Used  Substance Use Topics  . Alcohol use: No    Alcohol/week: 0.0 standard drinks     Current Outpatient Medications:  .  Cholecalciferol (VITAMIN D) 2000 units tablet, Take 2,000 Units by mouth daily., Disp: , Rfl:  .  diclofenac sodium (VOLTAREN) 1 % GEL, Apply 4 g topically 4 (four) times daily., Disp: 100 g, Rfl: 0 .  magnesium oxide (MAG-OX) 400 MG tablet, Take 400 mg by mouth daily., Disp: , Rfl:  .  Cyanocobalamin (B-12-SL) 1000 MCG SUBL, Place 1 tablet under the tongue at bedtime., Disp: , Rfl:  .  Lactobacillus (PROBIOTIC ACIDOPHILUS PO), Take by mouth. One tablespoon daily, Disp: , Rfl:  .  mometasone (ELOCON) 0.1 % cream, , Disp: , Rfl:   Allergies  Allergen Reactions  . Macrobid [Nitrofurantoin Macrocrystal]   . Terazol 3 [Terconazole]     ROS   No other specific complaints in a complete review of systems (except as listed in HPI above).  Objective  Vitals:   12/13/17 1354  BP: 124/76  Pulse: 81  Resp: 12  Temp: 98.2 F (36.8 C)  TempSrc: Oral  SpO2: 98%  Weight: 134 lb 1.6 oz (60.8 kg)  Height: 5' 4.5" (1.638 m)    Body mass index is 22.66 kg/m.  Nursing Note and Vital Signs reviewed.  Physical Exam  Constitutional: She appears well-developed and well-nourished.  HENT:  Head: Normocephalic and atraumatic.  Right Ear: Hearing, tympanic membrane, external ear and ear canal normal.  Left Ear: Hearing, tympanic membrane, external ear and ear canal normal.  Nose: Nose normal.  Mouth/Throat: Oropharynx is clear and moist. No oropharyngeal exudate.  Eyes: Pupils are equal, round, and reactive to light. Conjunctivae and EOM are normal. Right eye exhibits no discharge. Left eye exhibits no discharge.    Cardiovascular: Normal rate.  Pulmonary/Chest: Effort normal.  Skin: Skin is warm and dry.  Psychiatric: She has a normal mood and affect. Her behavior is normal.        No results found for this or any previous visit (from the past 48 hour(s)).  Assessment & Plan  1. Blepharitis of right upper eyelid, unspecified type - Warm compresses for 5-10 minutes twice daily  - Eyelid scrubs: warm moist washcloth  Wit diluted baby shampoo cleanse lid twice a day  - Omega-3-acid ethyl esters 2000 mg by mouth three times day  - artificial tears twice daily; to affected eye twice a day and additionally if needed.  - If you noticed yellow discharge, worsening pain, redness or swelling please call and let us know - If having eye pain or vision changes please go to eye doctor

## 2017-12-13 NOTE — Patient Instructions (Addendum)
-   Warm compresses for 5-10 minutes twice daily  - Eyelid scrubs: warm moist washcloth  Wit diluted baby shampoo cleanse lid twice a day  - Omega-3-acid ethyl esters 2000 mg by mouth three times day  - artificial tears twice daily; to affected eye twice a day and additionally if needed.  - If you noticed yellow discharge, worsening pain, redness or swelling please call and let us know - If having eye pain or vision changes please go to eye doctor    Blepharitis Blepharitis means swollen eyelids. Follow these instructions at home: Pay attention to any changes in how you look or feel. Follow these instructions to help with your condition: Keeping Clean  Wash your hands often.  Wash your eyelids with warm water, or wash them with warm water that is mixed with little bit of baby shampoo. Do this 2 or more times per day.  Wash your face and eyebrows at least once a day.  Use a clean towel each time you dry your eyelids. Do not use the towel to clean or dry other areas of your body. Do not share your towel with anyone. General instructions  Avoid wearing makeup until you get better. Do not share makeup with anyone.  Avoid rubbing your eyes.  Put a warm compress on your eyes 2 times per day for 10 minutes at a time or as told by your doctor.  If you were told to use an medicated cream or eye drops, use the medicine as told by your doctor. Do not stop using the medicine even if you feel better.  Keep all follow-up visits as told by your doctor. This is important. Contact a doctor if:  Your eyelids feel hot.  You have blisters on your eyelids.  You have a rash on your eyelids.  The swelling does not go away in 2-4 days.  The swelling gets worse. Get help right away if:  You have pain that gets worse.  You have pain that spreads to other parts of your face.  You have redness that gets worse.  You have redness that spreads to other parts of your face.  Your vision  changes.  You have pain when you look at lights or things that move.  You have a fever. This information is not intended to replace advice given to you by your health care provider. Make sure you discuss any questions you have with your health care provider. Document Released: 11/24/2007 Document Revised: 07/23/2015 Document Reviewed: 06/09/2014 Elsevier Interactive Patient Education  [employer].

## 2017-12-15 LAB — HM COLONOSCOPY

## 2017-12-18 ENCOUNTER — Telehealth: Payer: Self-pay

## 2017-12-18 NOTE — Telephone Encounter (Signed)
Copied from CRM [id number]. Topic: General - Other >> Dec 18, 2017  2:51 PM Burchel, Abbi R wrote: Pt requesting a note excusing her from work for the afternoon of 10/16 due to her appt with Lanora Manis. Please fax note to her employer:   Fax [phone] Attn: Ms. Cheryll Dessert

## 2017-12-19 ENCOUNTER — Encounter: Payer: Self-pay | Admitting: Family Medicine

## 2018-02-12 LAB — HM MAMMOGRAPHY: HM Mammogram: NORMAL (ref 0–4)

## 2018-02-14 ENCOUNTER — Other Ambulatory Visit: Payer: Self-pay | Admitting: Gastroenterology

## 2018-02-14 DIAGNOSIS — R1084 Generalized abdominal pain: Secondary | ICD-10-CM

## 2018-02-19 ENCOUNTER — Other Ambulatory Visit: Payer: Self-pay

## 2018-02-19 ENCOUNTER — Encounter: Payer: Self-pay | Admitting: Family Medicine

## 2018-02-26 ENCOUNTER — Ambulatory Visit
Admission: RE | Admit: 2018-02-26 | Discharge: 2018-02-26 | Disposition: A | Discharge status: Home / Self Care | Payer: BC Managed Care – PPO | Source: Ambulatory Visit | Attending: Gastroenterology | Admitting: Gastroenterology

## 2018-02-26 ENCOUNTER — Encounter: Payer: Self-pay | Admitting: Radiology

## 2018-02-26 DIAGNOSIS — R1084 Generalized abdominal pain: Secondary | ICD-10-CM

## 2018-02-26 MED ORDER — IOPAMIDOL (ISOVUE-300) INJECTION 61%: 100.0000 mL | Freq: Once | INTRAVENOUS | Status: DC | PRN

## 2018-02-26 MED ORDER — IOPAMIDOL (ISOVUE-300) INJECTION 61%
100.0000 mL | Freq: Once | INTRAVENOUS | Status: AC | PRN
  Administered 2018-02-26: 100 mL via INTRAVENOUS

## 2018-05-01 ENCOUNTER — Ambulatory Visit: Payer: Self-pay

## 2018-05-01 ENCOUNTER — Other Ambulatory Visit: Payer: Self-pay | Admitting: Family Medicine

## 2018-05-01 NOTE — Telephone Encounter (Signed)
      Dana Arnold [facility] Female, 68 y.o., 1950-06-16 MRN:  [id number] Phone:  [phone] Judie Petit) PCP:  Alba Cory, MD Primary Cvg:  [employer] Message from Lorayne Bender sent at 05/01/2018 1:32 PM EST   Summary: flu exposure   Pt's spouse has tested positive for Flu A today. Husbands physician recommended that pt call in and see if she could get preventative tamiflu. Pt denies current symptoms - states her throat is a touch sore and some drainage down the back of her throat. Pt uses [facility] - [place], [place] - [address] [phone] (Phone) [phone] (Fax)          Call History    Type Contact Phone User  05/01/2018 01:31 PM Phone (Incoming) Anagabriela, You (Self) [phone] Rexene Edison) Lyn Hollingshead, Amber L

## 2018-05-01 NOTE — Telephone Encounter (Signed)
It is no longer standard of care to give Tamiflu for prophylaxis unless the person exposed is high risk ( DM, heart disease, COPD) The best prevention for the flu is the flu shot.  Also since she has sore throat, hard to say if already having symptoms of the flu. Please contact patient.  I can send tamiflu daily for 10 days if she insists.

## 2018-05-02 NOTE — Telephone Encounter (Signed)
Left a message with Dr. Carlynn Purl answer regarding Tamiflu.

## 2018-09-14 ENCOUNTER — Encounter: Payer: Self-pay | Admitting: Family Medicine

## 2018-10-26 ENCOUNTER — Ambulatory Visit: Payer: BC Managed Care – PPO | Admitting: Family Medicine

## 2018-11-27 ENCOUNTER — Encounter: Payer: Self-pay | Admitting: Family Medicine

## 2018-11-27 ENCOUNTER — Other Ambulatory Visit: Payer: Self-pay

## 2018-11-27 ENCOUNTER — Ambulatory Visit (INDEPENDENT_AMBULATORY_CARE_PROVIDER_SITE_OTHER): Payer: BC Managed Care – PPO | Admitting: Family Medicine

## 2018-11-27 VITALS — BP 140/90 | HR 76 | Temp 96.9°F | Resp 16 | Ht 64.5 in | Wt 134.3 lb

## 2018-11-27 DIAGNOSIS — M1711 Unilateral primary osteoarthritis, right knee: Secondary | ICD-10-CM

## 2018-11-27 DIAGNOSIS — E162 Hypoglycemia, unspecified: Secondary | ICD-10-CM

## 2018-11-27 DIAGNOSIS — M810 Age-related osteoporosis without current pathological fracture: Secondary | ICD-10-CM

## 2018-11-27 DIAGNOSIS — Z13220 Encounter for screening for lipoid disorders: Secondary | ICD-10-CM

## 2018-11-27 DIAGNOSIS — R61 Generalized hyperhidrosis: Secondary | ICD-10-CM

## 2018-11-27 DIAGNOSIS — E039 Hypothyroidism, unspecified: Secondary | ICD-10-CM | POA: Diagnosis not present

## 2018-11-27 DIAGNOSIS — E038 Other specified hypothyroidism: Secondary | ICD-10-CM

## 2018-11-27 DIAGNOSIS — Z780 Asymptomatic menopausal state: Secondary | ICD-10-CM

## 2018-11-27 DIAGNOSIS — M8580 Other specified disorders of bone density and structure, unspecified site: Secondary | ICD-10-CM

## 2018-11-27 DIAGNOSIS — R739 Hyperglycemia, unspecified: Secondary | ICD-10-CM

## 2018-11-27 DIAGNOSIS — Z23 Encounter for immunization: Secondary | ICD-10-CM | POA: Diagnosis not present

## 2018-11-27 DIAGNOSIS — E559 Vitamin D deficiency, unspecified: Secondary | ICD-10-CM

## 2018-11-27 MED ORDER — BLOOD GLUCOSE METER KIT: PACK | 0 refills | Status: DC

## 2018-11-27 MED ORDER — DICLOFENAC SODIUM 1 % TD GEL: 4.0000 g | Freq: Four times a day (QID) | TRANSDERMAL | 0 refills | Status: DC

## 2018-11-27 NOTE — Progress Notes (Signed)
Name: Dana Arnold   MRN: [id number]    DOB: 10-Mar-1950   Date:11/27/2018       Progress Note  Subjective  Chief Complaint  Chief Complaint  Patient presents with  . blood sugar concerns    HPI  Hot Flashes and night sweats: she states she reached menopause years ago but recently noticing night sweats that the bed is all wet and also her night gown. It started about 4-5 weeks ago. She is concerned about hypoglycemia she states she feels tired and has a snack and it seems to help with fatigue. She has not checked her glucose at home. Normal ovaries on CT done 01/2018   OA right knee: she has a history of intermittent pain on right knee, no effusion. She uses voltaren prn and it works well for her   Subclinical hypothyroidism: last TSH's have been normal, no dry skin, no change is bowel movements, no difficulty swallowing.   Low Vitamin D level and osteopenia: we will recheck vitamin D level, discussed high calcium level  Patient Active Problem List   Diagnosis Date Noted  . Scoliosis of lumbar spine 04/22/2016  . Thoracic scoliosis 09/08/2015  . Chondromalacia 03/10/2015  . Arthritis, degenerative 12/24/2014  . Hyperglycemia 12/24/2014  . Cervical radiculitis 12/24/2014  . Subclinical hypothyroidism 12/24/2014  . Vitamin D deficiency 12/24/2014  . Hx of Clostridium difficile infection 12/24/2014  . External hemorrhoid 12/24/2014  . Chronic gastric erosion 12/24/2014  . Chondrocalcinosis of right knee 12/24/2014  . Osteopenia 12/24/2014  . DDD (degenerative disc disease), lumbar 12/24/2014    Past Surgical History:  Procedure Laterality Date  . arthroscopic knee Right 05/26/2014  . CHOLECYSTECTOMY  02/1989  . right ulna surgery Right 07/01/2010  . TONSILLECTOMY AND ADENOIDECTOMY Bilateral 1961    Family History  Problem Relation Age of Onset  . Osteoporosis Mother   . Aneurysm Father     Social History   Socioeconomic History  . Marital status: Married    Spouse  name: Renae Fickle   . Number of children: 3  . Years of education: Not on file  . Highest education level: Associate degree: occupational, Scientist, product/process development, or vocational program  Occupational History  . Occupation:  Runner, broadcasting/film/video assistance     Comment: [employer]   Social Needs  . Financial resource strain: Not hard at all  . Food insecurity    Worry: Never true    Inability: Never true  . Transportation needs    Medical: No    Non-medical: No  Tobacco Use  . Smoking status: Never Smoker  . Smokeless tobacco: Never Used  Substance and Sexual Activity  . Alcohol use: No    Alcohol/week: 0.0 standard drinks  . Drug use: No  . Sexual activity: Yes    Partners: Male    Birth control/protection: Post-menopausal  Lifestyle  . Physical activity    Days per week: 3 days    Minutes per session: 30 min  . Stress: Not at all  Relationships  . Social connections    Talks on phone: More than three times a week    Gets together: More than three times a week    Attends religious service: More than 4 times per year    Active member of club or organization: Yes    Attends meetings of clubs or organizations: More than 4 times per year    Relationship status: Married  . Intimate partner violence    Fear of current or ex partner: No  Emotionally abused: No    Physically abused: No    Forced sexual activity: No  Other Topics Concern  . Not on file  Social History Narrative  . Not on file     Current Outpatient Medications:  .  Cholecalciferol (VITAMIN D) 2000 units tablet, Take 2,000 Units by mouth daily., Disp: , Rfl:  .  Cyanocobalamin (B-12-SL) 1000 MCG SUBL, Place 1 tablet under the tongue at bedtime., Disp: , Rfl:  .  diclofenac sodium (VOLTAREN) 1 % GEL, Apply 4 g topically 4 (four) times daily., Disp: 100 g, Rfl: 0 .  docusate sodium (COLACE) 100 MG capsule, Take 100 mg by mouth 3 (three) times daily., Disp: , Rfl:  .  magnesium oxide (MAG-OX) 400 MG tablet, Take 400 mg by mouth daily.,  Disp: , Rfl:  .  Turmeric (QC TUMERIC COMPLEX) 500 MG CAPS, Take by mouth., Disp: , Rfl:  .  mometasone (ELOCON) 0.1 % cream, , Disp: , Rfl:   Allergies  Allergen Reactions  . Macrobid [Nitrofurantoin Macrocrystal]   . Terazol 3 [Terconazole]     I personally reviewed active problem list, medication list, allergies, family history, social history, health maintenance with the patient/caregiver today.   ROS  Constitutional: Negative for fever or weight change.  Respiratory: Negative for cough and shortness of breath.   Cardiovascular: Negative for chest pain or palpitations.  Gastrointestinal: Negative for abdominal pain, no bowel changes.  Musculoskeletal: Negative for gait problem or joint swelling.  Skin: Negative for rash.  Neurological: Negative for dizziness or headache.  No other specific complaints in a complete review of systems (except as listed in HPI above).  Objective  Vitals:   11/27/18 0751  BP: 140/90  Pulse: 76  Resp: 16  Temp: (!) 96.9 F (36.1 C)  TempSrc: Temporal  SpO2: 98%  Weight: 134 lb 4.8 oz (60.9 kg)  Height: 5' 4.5" (1.638 m)    Body mass index is 22.7 kg/m.  Physical Exam  Constitutional: Patient appears well-developed and well-nourished.  No distress.  HEENT: head atraumatic, normocephalic, pupils equal and reactive to light,neck supple Cardiovascular: Normal rate, regular rhythm and normal heart sounds.  No murmur heard. No BLE edema. Pulmonary/Chest: Effort normal and breath sounds normal. No respiratory distress. Abdominal: Soft.  There is no tenderness. Psychiatric: Patient has a normal mood and affect. behavior is normal. Judgment and thought content normal.  PHQ2/9: Depression screen [facility] 2/9 11/27/2018 11/27/2018 12/13/2017 01/31/2017 04/22/2016  Decreased Interest 0 0 0 0 0  Down, Depressed, Hopeless 0 0 0 0 0  PHQ - 2 Score 0 0 0 0 0  Altered sleeping 0 0 0 - -  Tired, decreased energy 0 0 0 - -  Change in appetite 0 0 0 - -   Feeling bad or failure about yourself  0 0 0 - -  Trouble concentrating 0 0 0 - -  Moving slowly or fidgety/restless 0 0 0 - -  Suicidal thoughts 0 0 0 - -  PHQ-9 Score 0 0 0 - -  Difficult doing work/chores - - Not difficult at all - -    phq 9 is negative   Fall Risk: Fall Risk  11/27/2018 12/13/2017 01/31/2017 04/22/2016 05/14/2015  Falls in the past year? 0 No No No No  Number falls in past yr: 0 - - - -  Injury with Fall? 0 - - - -  Risk for fall due to : - - History of fall(s) - -  Risk  for fall due to: Comment - - patient had a fall at the school about 7 yrs. - -     Functional Status Survey: Is the patient deaf or have difficulty hearing?: No Does the patient have difficulty seeing, even when wearing glasses/contacts?: No Does the patient have difficulty concentrating, remembering, or making decisions?: No Does the patient have difficulty walking or climbing stairs?: No Does the patient have difficulty dressing or bathing?: No Does the patient have difficulty doing errands alone such as visiting a doctor's office or shopping?: No    Assessment & Plan  1. Hypoglycemia  - Hemoglobin A1c - COMPLETE METABOLIC PANEL WITH GFR - Insulin, Free (Bioactive)  2. Need for shingles vaccine  - Varicella-zoster vaccine IM (Shingrix)  3. Night sweats  - CBC with Differential/Platelet - COMPLETE METABOLIC PANEL WITH GFR - Estrogens, total  4. Subclinical hypothyroidism  - TSH  5. Vitamin D deficiency  - VITAMIN D 25 Hydroxy (Vit-D Deficiency, Fractures) - DG Bone Density; Future  6. Hyperglycemia  - Hemoglobin A1c  7. Lipid screening  - Lipid panel  8. Primary osteoarthritis of right knee  - diclofenac sodium (VOLTAREN) 1 % GEL; Apply 4 g topically 4 (four) times daily.  Dispense: 100 g; Refill: 0  9. Osteopenia after menopause  - DG Bone Density; Future

## 2018-11-27 NOTE — Patient Instructions (Signed)
Fasting level 80-130 2 hours after meals below 180  When you feel sweaty check sugar It should not be below 80

## 2018-11-28 ENCOUNTER — Encounter: Payer: Self-pay | Admitting: Family Medicine

## 2018-11-30 LAB — CBC WITH DIFFERENTIAL/PLATELET
Absolute Monocytes: 432 cells/uL (ref 200–950)
Basophils Absolute: 48 cells/uL (ref 0–200)
Basophils Relative: 1 %
Eosinophils Absolute: 82 cells/uL (ref 15–500)
Eosinophils Relative: 1.7 %
HCT: 43 % (ref 35.0–45.0)
Hemoglobin: 13.9 g/dL (ref 11.7–15.5)
Lymphs Abs: 1910 cells/uL (ref 850–3900)
MCH: 30.8 pg (ref 27.0–33.0)
MCHC: 32.3 g/dL (ref 32.0–36.0)
MCV: 95.1 fL (ref 80.0–100.0)
MPV: 11.4 fL (ref 7.5–12.5)
Monocytes Relative: 9 %
Neutro Abs: 2328 cells/uL (ref 1500–7800)
Neutrophils Relative %: 48.5 %
Platelets: 214 10*3/uL (ref 140–400)
RBC: 4.52 10*6/uL (ref 3.80–5.10)
RDW: 12 % (ref 11.0–15.0)
Total Lymphocyte: 39.8 %
WBC: 4.8 10*3/uL (ref 3.8–10.8)

## 2018-11-30 LAB — HEMOGLOBIN A1C
Hgb A1c MFr Bld: 5.6 % of total Hgb (ref <5.7)
Mean Plasma Glucose: 114 (calc)
eAG (mmol/L): 6.3 (calc)

## 2018-11-30 LAB — COMPLETE METABOLIC PANEL WITH GFR
AG Ratio: 1.8 (calc) (ref 1.0–2.5)
ALT: 11 U/L (ref 6–29)
AST: 23 U/L (ref 10–35)
Albumin: 4.2 g/dL (ref 3.6–5.1)
Alkaline phosphatase (APISO): 49 U/L (ref 37–153)
BUN: 17 mg/dL (ref 7–25)
CO2: 32 mmol/L (ref 20–32)
Calcium: 9.8 mg/dL (ref 8.6–10.4)
Chloride: 107 mmol/L (ref 98–110)
Creat: 0.72 mg/dL (ref 0.50–0.99)
GFR, Est African American: 102 mL/min/{1.73_m2} (ref >=60)
GFR, Est Non African American: 88 mL/min/{1.73_m2} (ref >=60)
Globulin: 2.4 g/dL (calc) (ref 1.9–3.7)
Glucose, Bld: 91 mg/dL (ref 65–99)
Potassium: 4.8 mmol/L (ref 3.5–5.3)
Sodium: 143 mmol/L (ref 135–146)
Total Bilirubin: 0.4 mg/dL (ref 0.2–1.2)
Total Protein: 6.6 g/dL (ref 6.1–8.1)

## 2018-11-30 LAB — LIPID PANEL
Cholesterol: 230 mg/dL — ABNORMAL HIGH (ref <200)
HDL: 73 mg/dL (ref >=50)
LDL Cholesterol (Calc): 142 mg/dL (calc) — ABNORMAL HIGH
Non-HDL Cholesterol (Calc): 157 mg/dL (calc) — ABNORMAL HIGH (ref <130)
Total CHOL/HDL Ratio: 3.2 (calc) (ref <5.0)
Triglycerides: 54 mg/dL (ref <150)

## 2018-11-30 LAB — VITAMIN D 25 HYDROXY (VIT D DEFICIENCY, FRACTURES): Vit D, 25-Hydroxy: 34 ng/mL (ref 30–100)

## 2018-11-30 LAB — ESTROGENS, TOTAL: Estrogen: 90.6 pg/mL

## 2018-11-30 LAB — TSH: TSH: 4.85 mIU/L — ABNORMAL HIGH (ref 0.40–4.50)

## 2018-12-04 ENCOUNTER — Telehealth: Payer: Self-pay

## 2018-12-04 NOTE — Telephone Encounter (Signed)
Copied from [place] [id number]. Topic: General - Other >> Dec 04, 2018  8:10 AM Celene Kras A wrote: Reason for CRM: Pt called and is requesting to have her lab results. Please advise.   Please release her labs.

## 2018-12-10 ENCOUNTER — Telehealth: Payer: Self-pay

## 2018-12-10 NOTE — Telephone Encounter (Signed)
Copied from [place] [id number]. Topic: Appointment Scheduling - Scheduling Inquiry for Clinic >> Dec 10, 2018  7:20 AM Lennox Solders wrote: Reason for CRM: pt has internal hemmorroid that came out on Friday and would like to see dr Ancil Boozer today >> Dec 10, 2018  9:32 AM Myatt, Marland Kitchen wrote: lvm for pt to call back to get scheduled.

## 2018-12-10 NOTE — Telephone Encounter (Signed)
Patient is scheduled to see Dr. Ancil Boozer on tomorrow, 12/11/2018 due no availability for today.

## 2018-12-11 ENCOUNTER — Ambulatory Visit: Payer: BC Managed Care – PPO | Admitting: Family Medicine

## 2018-12-11 ENCOUNTER — Encounter: Payer: Self-pay | Admitting: Family Medicine

## 2018-12-11 ENCOUNTER — Other Ambulatory Visit: Payer: Self-pay

## 2018-12-11 VITALS — BP 130/80 | HR 75 | Temp 97.3°F | Resp 16 | Ht 64.5 in | Wt 133.7 lb

## 2018-12-11 DIAGNOSIS — K644 Residual hemorrhoidal skin tags: Secondary | ICD-10-CM | POA: Diagnosis not present

## 2018-12-11 DIAGNOSIS — E162 Hypoglycemia, unspecified: Secondary | ICD-10-CM | POA: Diagnosis not present

## 2018-12-11 MED ORDER — HYDROCORTISONE ACETATE 25 MG RE SUPP: 25.0000 mg | Freq: Two times a day (BID) | RECTAL | 0 refills | Status: DC

## 2018-12-11 NOTE — Progress Notes (Signed)
Name: Dana Arnold   MRN: [id number]    DOB: 30-Sep-1950   Date:12/11/2018       Progress Note  Subjective  Chief Complaint  Chief Complaint  Patient presents with  . Hemorrhoids    Patient has recurrent hemmorroids since 2011. She has been doing sitz bath, preparation H. She has pain, but no bleeding.    HPI  She has a long history of constipation, she states 5 days ago after a bowel movement she felt pain on anal area and a palpable lump, she has been using sitz baths and preparation H, even a preparation H suppository and size has gone down, she still has some pain. No blood in stools, no fever or chills.   Patient Active Problem List   Diagnosis Date Noted  . Scoliosis of lumbar spine 04/22/2016  . Thoracic scoliosis 09/08/2015  . Chondromalacia 03/10/2015  . Arthritis, degenerative 12/24/2014  . Hyperglycemia 12/24/2014  . Cervical radiculitis 12/24/2014  . Subclinical hypothyroidism 12/24/2014  . Vitamin D deficiency 12/24/2014  . Hx of Clostridium difficile infection 12/24/2014  . External hemorrhoid 12/24/2014  . Chronic gastric erosion 12/24/2014  . Chondrocalcinosis of right knee 12/24/2014  . Osteopenia 12/24/2014  . DDD (degenerative disc disease), lumbar 12/24/2014    Social History   Tobacco Use  . Smoking status: Never Smoker  . Smokeless tobacco: Never Used  Substance Use Topics  . Alcohol use: No    Alcohol/week: 0.0 standard drinks     Current Outpatient Medications:  .  blood glucose meter kit and supplies, Dispense based on patient and insurance preference. Use up to four times daily as directed. Hypoglycemia episodes, Disp: 1 each, Rfl: 0 .  Cholecalciferol (VITAMIN D) 2000 units tablet, Take 2,000 Units by mouth daily., Disp: , Rfl:  .  diclofenac sodium (VOLTAREN) 1 % GEL, Apply 4 g topically 4 (four) times daily., Disp: 100 g, Rfl: 0 .  docusate sodium (COLACE) 100 MG capsule, Take 100 mg by mouth 3 (three) times daily., Disp: , Rfl:  .   magnesium oxide (MAG-OX) 400 MG tablet, Take 400 mg by mouth daily., Disp: , Rfl:  .  Turmeric (QC TUMERIC COMPLEX) 500 MG CAPS, Take by mouth., Disp: , Rfl:  .  Cyanocobalamin (B-12-SL) 1000 MCG SUBL, Place 1 tablet under the tongue at bedtime., Disp: , Rfl:  .  hydrocortisone (ANUSOL-HC) 25 MG suppository, Place 1 suppository (25 mg total) rectally 2 (two) times daily., Disp: 12 suppository, Rfl: 0 .  mometasone (ELOCON) 0.1 % cream, , Disp: , Rfl:   Allergies  Allergen Reactions  . Macrobid [Nitrofurantoin Macrocrystal]   . Terazol 3 [Terconazole]     ROS  Ten systems reviewed and is negative except as mentioned in HPI   Objective  Vitals:   12/11/18 0941  BP: 130/80  Pulse: 75  Resp: 16  Temp: (!) 97.3 F (36.3 C)  TempSrc: Temporal  SpO2: 98%  Weight: 133 lb 11.2 oz (60.6 kg)  Height: 5' 4.5" (1.638 m)    Body mass index is 22.6 kg/m.    Physical Exam  Constitutional: Patient appears well-developed and well-nourished. No distress.  HEENT: head atraumatic, normocephalic, pupils equal and reactive to light Cardiovascular: Normal rate, regular rhythm and normal heart sounds.  No murmur heard. No BLE edema. Pulmonary/Chest: Effort normal and breath sounds normal. No respiratory distress. Abdominal: Soft.  There is no tenderness., rectal showed tender external hemorrhoid, normal sphincter tonus, no fissures, hemorrhoid not thrombosed Psychiatric: Patient has  a normal mood and affect. behavior is normal. Judgment and thought content normal.  Recent Results (from the past 2160 hour(s))  Lipid panel     Status: Abnormal   Collection Time: 11/27/18 12:00 AM  Result Value Ref Range   Cholesterol 230 (H) <200 mg/dL   HDL 73 > OR = 50 mg/dL   Triglycerides 54 <150 mg/dL   LDL Cholesterol (Calc) 142 (H) mg/dL (calc)    Comment: Reference range: <100 . Desirable range <100 mg/dL for primary prevention;   <70 mg/dL for patients with CHD or diabetic patients  with > or =  2 CHD risk factors. Marland Kitchen LDL-C is now calculated using the Martin-Hopkins  calculation, which is a validated novel method providing  better accuracy than the Friedewald equation in the  estimation of LDL-C.  Cresenciano Genre et al. Annamaria Helling. 9892;119(41): 2061-2068  (http://education.[facility].com/faq/FAQ164)    Total CHOL/HDL Ratio 3.2 <5.0 (calc)   Non-HDL Cholesterol (Calc) 157 (H) <130 mg/dL (calc)    Comment: For patients with diabetes plus 1 major ASCVD risk  factor, treating to a non-HDL-C goal of <100 mg/dL  (LDL-C of <70 mg/dL) is considered a therapeutic  option.   Hemoglobin A1c     Status: None   Collection Time: 11/27/18 12:00 AM  Result Value Ref Range   Hgb A1c MFr Bld 5.6 <5.7 % of total Hgb    Comment: For the purpose of screening for the presence of diabetes: . <5.7%       Consistent with the absence of diabetes 5.7-6.4%    Consistent with increased risk for diabetes             (prediabetes) > or =6.5%  Consistent with diabetes . This assay result is consistent with a decreased risk of diabetes. . Currently, no consensus exists regarding use of hemoglobin A1c for diagnosis of diabetes in children. . According to American Diabetes Association (ADA) guidelines, hemoglobin A1c <7.0% represents optimal control in non-pregnant diabetic patients. Different metrics may apply to specific patient populations.  Standards of Medical Care in Diabetes(ADA). .    Mean Plasma Glucose 114 (calc)   eAG (mmol/L) 6.3 (calc)  CBC with Differential/Platelet     Status: None   Collection Time: 11/27/18 12:00 AM  Result Value Ref Range   WBC 4.8 3.8 - 10.8 Thousand/uL   RBC 4.52 3.80 - 5.10 Million/uL   Hemoglobin 13.9 11.7 - 15.5 g/dL   HCT 43.0 35.0 - 45.0 %   MCV 95.1 80.0 - 100.0 fL   MCH 30.8 27.0 - 33.0 pg   MCHC 32.3 32.0 - 36.0 g/dL   RDW 12.0 11.0 - 15.0 %   Platelets 214 140 - 400 Thousand/uL   MPV 11.4 7.5 - 12.5 fL   Neutro Abs 2,328 1,500 - 7,800 cells/uL    Lymphs Abs 1,910 850 - 3,900 cells/uL   Absolute Monocytes 432 200 - 950 cells/uL   Eosinophils Absolute 82 15 - 500 cells/uL   Basophils Absolute 48 0 - 200 cells/uL   Neutrophils Relative % 48.5 %   Total Lymphocyte 39.8 %   Monocytes Relative 9.0 %   Eosinophils Relative 1.7 %   Basophils Relative 1.0 %  COMPLETE METABOLIC PANEL WITH GFR     Status: None   Collection Time: 11/27/18 12:00 AM  Result Value Ref Range   Glucose, Bld 91 65 - 99 mg/dL    Comment: .            Fasting reference  interval .    BUN 17 7 - 25 mg/dL   Creat 0.72 0.50 - 0.99 mg/dL    Comment: For patients >31 years of age, the reference limit for Creatinine is approximately 13% higher for people identified as African-American. .    GFR, Est Non African American 88 > OR = 60 mL/min/1.41m   GFR, Est African American 102 > OR = 60 mL/min/1.718m  BUN/Creatinine Ratio NOT APPLICABLE 6 - 22 (calc)   Sodium 143 135 - 146 mmol/L   Potassium 4.8 3.5 - 5.3 mmol/L   Chloride 107 98 - 110 mmol/L   CO2 32 20 - 32 mmol/L   Calcium 9.8 8.6 - 10.4 mg/dL   Total Protein 6.6 6.1 - 8.1 g/dL   Albumin 4.2 3.6 - 5.1 g/dL   Globulin 2.4 1.9 - 3.7 g/dL (calc)   AG Ratio 1.8 1.0 - 2.5 (calc)   Total Bilirubin 0.4 0.2 - 1.2 mg/dL   Alkaline phosphatase (APISO) 49 37 - 153 U/L   AST 23 10 - 35 U/L   ALT 11 6 - 29 U/L  TSH     Status: Abnormal   Collection Time: 11/27/18 12:00 AM  Result Value Ref Range   TSH 4.85 (H) 0.40 - 4.50 mIU/L  VITAMIN D 25 Hydroxy (Vit-D Deficiency, Fractures)     Status: None   Collection Time: 11/27/18 12:00 AM  Result Value Ref Range   Vit D, 25-Hydroxy 34 30 - 100 ng/mL    Comment: Vitamin D Status         25-OH Vitamin D: . Deficiency:                    <20 ng/mL Insufficiency:             20 - 29 ng/mL Optimal:                 > or = 30 ng/mL . For 25-OH Vitamin D testing on patients on  D2-supplementation and patients for whom quantitation  of D2 and D3 fractions is required,  the [facility](TM) 25-OH VIT D, (D2,D3), LC/MS/MS is recommended: order  code [phone] >2y23yr See Note 1 . Note 1 . For additional information, please refer to  http://education.[facility].com/faq/FAQ199  (This link is being provided for informational/ educational purposes only.)   Estrogens, total     Status: None   Collection Time: 11/27/18 12:00 AM  Result Value Ref Range   Estrogen 90.6 pg/mL    Comment: . Reference Ranges for Total Estrogen: .   Follicular Phase        (1-12 days):  90-590 pg/mL   Luteal Phase:     130-460 pg/mL   Postmenopausal:    50-170 pg/mL . The total estrogen assay is not recommended for use in pre-pubertal children.      Assessment & Plan   1. Inflamed external hemorrhoid  - hydrocortisone (ANUSOL-HC) 25 MG suppository; Place 1 suppository (25 mg total) rectally 2 (two) times daily.  Dispense: 12 suppository; Refill: 0  2. Hypoglycemia  She brought a log of her glucose, it goes down to 50's about 2-3 hours after meals. Discussed a diabetic diet, avoid sweets and have regular protein snacks

## 2019-01-01 ENCOUNTER — Ambulatory Visit
Admission: RE | Admit: 2019-01-01 | Discharge: 2019-01-01 | Disposition: A | Discharge status: Home / Self Care | Payer: BC Managed Care – PPO | Source: Ambulatory Visit | Attending: Family Medicine | Admitting: Family Medicine

## 2019-01-01 DIAGNOSIS — E559 Vitamin D deficiency, unspecified: Secondary | ICD-10-CM | POA: Diagnosis not present

## 2019-01-01 DIAGNOSIS — M8580 Other specified disorders of bone density and structure, unspecified site: Secondary | ICD-10-CM | POA: Diagnosis present

## 2019-01-03 ENCOUNTER — Encounter: Payer: Self-pay | Admitting: Family Medicine

## 2019-01-03 DIAGNOSIS — M810 Age-related osteoporosis without current pathological fracture: Secondary | ICD-10-CM | POA: Insufficient documentation

## 2019-02-14 LAB — HM MAMMOGRAPHY

## 2019-02-19 DIAGNOSIS — M1990 Unspecified osteoarthritis, unspecified site: Secondary | ICD-10-CM | POA: Insufficient documentation

## 2019-03-08 ENCOUNTER — Encounter: Payer: Self-pay | Admitting: Family Medicine

## 2019-03-11 ENCOUNTER — Ambulatory Visit: Payer: BC Managed Care – PPO | Attending: Internal Medicine

## 2019-03-11 DIAGNOSIS — Z20822 Contact with and (suspected) exposure to covid-19: Secondary | ICD-10-CM

## 2019-03-12 LAB — NOVEL CORONAVIRUS, NAA: SARS-CoV-2, NAA: NOT DETECTED

## 2019-04-05 DIAGNOSIS — G8929 Other chronic pain: Secondary | ICD-10-CM | POA: Insufficient documentation

## 2019-05-05 ENCOUNTER — Encounter: Payer: Self-pay | Admitting: *Deleted

## 2019-05-05 ENCOUNTER — Emergency Department
Admission: EM | Admit: 2019-05-05 | Discharge: 2019-05-05 | Disposition: A | Discharge status: Home / Self Care | Payer: BC Managed Care – PPO | Attending: Emergency Medicine | Admitting: Emergency Medicine

## 2019-05-05 ENCOUNTER — Emergency Department: Payer: BC Managed Care – PPO

## 2019-05-05 ENCOUNTER — Other Ambulatory Visit: Payer: Self-pay

## 2019-05-05 DIAGNOSIS — A0472 Enterocolitis due to Clostridium difficile, not specified as recurrent: Secondary | ICD-10-CM | POA: Diagnosis not present

## 2019-05-05 DIAGNOSIS — Z792 Long term (current) use of antibiotics: Secondary | ICD-10-CM | POA: Insufficient documentation

## 2019-05-05 DIAGNOSIS — R1032 Left lower quadrant pain: Secondary | ICD-10-CM | POA: Diagnosis not present

## 2019-05-05 DIAGNOSIS — R197 Diarrhea, unspecified: Secondary | ICD-10-CM | POA: Diagnosis present

## 2019-05-05 LAB — COMPREHENSIVE METABOLIC PANEL
ALT: 16 U/L (ref 0–44)
AST: 28 U/L (ref 15–41)
Albumin: 4 g/dL (ref 3.5–5.0)
Alkaline Phosphatase: 60 U/L (ref 38–126)
Anion gap: 7 (ref 5–15)
BUN: 14 mg/dL (ref 8–23)
CO2: 26 mmol/L (ref 22–32)
Calcium: 9 mg/dL (ref 8.9–10.3)
Chloride: 102 mmol/L (ref 98–111)
Creatinine, Ser: 0.71 mg/dL (ref 0.44–1.00)
GFR calc Af Amer: >60 mL/min (ref >60)
GFR calc non Af Amer: >60 mL/min (ref >60)
Glucose, Bld: 132 mg/dL — ABNORMAL HIGH (ref 70–99)
Potassium: 3.3 mmol/L — ABNORMAL LOW (ref 3.5–5.1)
Sodium: 135 mmol/L (ref 135–145)
Total Bilirubin: 0.7 mg/dL (ref 0.3–1.2)
Total Protein: 7 g/dL (ref 6.5–8.1)

## 2019-05-05 LAB — CBC
HCT: 43 % (ref 36.0–46.0)
Hemoglobin: 13.8 g/dL (ref 12.0–15.0)
MCH: 30.5 pg (ref 26.0–34.0)
MCHC: 32.1 g/dL (ref 30.0–36.0)
MCV: 94.9 fL (ref 80.0–100.0)
Platelets: 226 10*3/uL (ref 150–400)
RBC: 4.53 MIL/uL (ref 3.87–5.11)
RDW: 13.1 % (ref 11.5–15.5)
WBC: 10.8 10*3/uL — ABNORMAL HIGH (ref 4.0–10.5)
nRBC: 0 % (ref 0.0–0.2)

## 2019-05-05 LAB — MAGNESIUM: Magnesium: 2 mg/dL (ref 1.7–2.4)

## 2019-05-05 LAB — LIPASE, BLOOD: Lipase: 28 U/L (ref 11–51)

## 2019-05-05 LAB — C DIFFICILE QUICK SCREEN W PCR REFLEX
C Diff antigen: POSITIVE — AB
C Diff interpretation: DETECTED
C Diff toxin: POSITIVE — AB

## 2019-05-05 MED ORDER — IOHEXOL 300 MG/ML  SOLN
75.0000 mL | Freq: Once | INTRAMUSCULAR | Status: AC | PRN
  Administered 2019-05-05: 75 mL via INTRAVENOUS

## 2019-05-05 MED ORDER — SODIUM CHLORIDE 0.9 % IV BOLUS
1000.0000 mL | Freq: Once | INTRAVENOUS | Status: AC
  Administered 2019-05-05: 1000 mL via INTRAVENOUS

## 2019-05-05 MED ORDER — VANCOMYCIN HCL 125 MG PO CAPS: 125.0000 mg | ORAL_CAPSULE | Freq: Four times a day (QID) | ORAL | 0 refills | Status: DC

## 2019-05-05 NOTE — ED Triage Notes (Addendum)
Patient c/o light, brown liquid stool beginning on Friday and sometimes is just mucous. Patient has been recently on 2 antibiotics for dental procedures. Patient also states she has an "extra long colon that has rotated" per her gastroenterologist. Patient c/o right lower quadrant abdominal pain.

## 2019-05-05 NOTE — ED Provider Notes (Signed)
[facility] Emergency Department Provider Note ____________________________________________   None    (approximate)  I have reviewed the triage vital signs and the nursing notes.   HISTORY  Chief Complaint Diarrhea  HPI Dana Arnold is a 69 y.o. female presenting to the emergency department for treatment and evaluation of diarrhea.  Patient states that her symptoms started 2 days ago.  She has had multiple episodes of light brown, watery diarrhea.  She has been on 2 different antibiotics due to a dental infection over the past month or so.  She does report a remote history of C. difficile after antibiotics.  She is having diffuse lower abdominal cramping that is worse on the left.  She has not noticed any blood, however she has noticed to external hemorrhoid since the onset of diarrhea.      Past Medical History:  Diagnosis Date  . Cervical radiculitis   . Cervicalgia   . Chondromalacia   . Chronic constipation   . Degeneration of lumbar or lumbosacral intervertebral disc   . External hemorrhoid, thrombosed   . Gastritis, chronic   . GERD (gastroesophageal reflux disease)   . Hyperglycemia   . Hyperlipidemia   . Lupus anticoagulant positive   . Olecranon bursitis   . Osteopenia   . Ovarian failure   . Paresthesia   . Vitamin D deficiency     Patient Active Problem List   Diagnosis Date Noted  . Osteoporosis, post-menopausal 01/03/2019  . Scoliosis of lumbar spine 04/22/2016  . Thoracic scoliosis 09/08/2015  . Chondromalacia 03/10/2015  . Arthritis, degenerative 12/24/2014  . Hyperglycemia 12/24/2014  . Cervical radiculitis 12/24/2014  . Subclinical hypothyroidism 12/24/2014  . Vitamin D deficiency 12/24/2014  . Hx of Clostridium difficile infection 12/24/2014  . External hemorrhoid 12/24/2014  . Chronic gastric erosion 12/24/2014  . Chondrocalcinosis of right knee 12/24/2014  . Osteopenia 12/24/2014  . DDD (degenerative disc disease),  lumbar 12/24/2014    Past Surgical History:  Procedure Laterality Date  . arthroscopic knee Right 05/26/2014  . CHOLECYSTECTOMY  02/1989  . right ulna surgery Right 07/01/2010  . TONSILLECTOMY AND ADENOIDECTOMY Bilateral 1961    Prior to Admission medications   Medication Sig Start Date End Date Taking? Authorizing Provider  blood glucose meter kit and supplies Dispense based on patient and insurance preference. Use up to four times daily as directed. Hypoglycemia episodes 11/27/18   Steele Sizer, MD  Cholecalciferol (VITAMIN D) 2000 units tablet Take 2,000 Units by mouth daily.    [provider]  Cyanocobalamin (B-12-SL) 1000 MCG SUBL Place 1 tablet under the tongue at bedtime.    [provider]  diclofenac sodium (VOLTAREN) 1 % GEL Apply 4 g topically 4 (four) times daily. 11/27/18   Steele Sizer, MD  docusate sodium (COLACE) 100 MG capsule Take 100 mg by mouth 3 (three) times daily.    [provider]  hydrocortisone (ANUSOL-HC) 25 MG suppository Place 1 suppository (25 mg total) rectally 2 (two) times daily. 12/11/18   Steele Sizer, MD  magnesium oxide (MAG-OX) 400 MG tablet Take 400 mg by mouth daily.    [provider]  mometasone (ELOCON) 0.1 % cream  01/26/17   [provider]  Turmeric (QC TUMERIC COMPLEX) 500 MG CAPS Take by mouth.    [provider]  vancomycin (VANCOCIN) 125 MG capsule Take 1 capsule (125 mg total) by mouth 4 (four) times daily. 05/05/19   Victorino Dike, FNP    Allergies  Macrobid [nitrofurantoin macrocrystal] and Terazol 3 [terconazole]  Family History  Problem Relation Age of Onset  . Osteoporosis Mother   . Aneurysm Father     Social History Social History   Tobacco Use  . Smoking status: Never Smoker  . Smokeless tobacco: Never Used  Substance Use Topics  . Alcohol use: No    Alcohol/week: 0.0 standard drinks  . Drug use: No    Review of Systems  Constitutional: No  fever/chills Eyes: No visual changes. ENT: No sore throat. Cardiovascular: Denies chest pain. Respiratory: Denies shortness of breath. Gastrointestinal: Positive for abdominal pain.  No nausea, no vomiting.  Positive for diarrhea.  No constipation. Genitourinary: Negative for dysuria. Musculoskeletal: Negative for back pain. Skin: Negative for rash. Neurological: Negative for headaches, focal weakness or numbness. ___________________________________________   PHYSICAL EXAM:  VITAL SIGNS: ED Triage Vitals  Enc Vitals Group     BP 05/05/19 1122 96/76     Pulse Rate 05/05/19 1122 (!) 110     Resp 05/05/19 1122 18     Temp 05/05/19 1122 98.4 F (36.9 C)     Temp Source 05/05/19 1122 Oral     SpO2 05/05/19 1122 96 %     Weight 05/05/19 1126 130 lb (59 kg)     Height 05/05/19 1126 '5\' 4"'$  (1.626 m)     Head Circumference --      Peak Flow --      Pain Score 05/05/19 1124 1     Pain Loc --      Pain Edu? --      Excl. in [place]? --     Constitutional: Alert and oriented. Well appearing and in no acute distress. Eyes: Conjunctivae are normal. Head: Atraumatic. Nose: No congestion/rhinnorhea. Mouth/Throat: Mucous membranes are moist.  Oropharynx non-erythematous. Neck: No stridor.   Hematological/Lymphatic/Immunilogical: No cervical lymphadenopathy. Cardiovascular: Normal rate, regular rhythm. Grossly normal heart sounds.  Good peripheral circulation. Respiratory: Normal respiratory effort.  No retractions. Lungs CTAB. Gastrointestinal: Bowel sounds active and present x4.  Soft and diffuse lower abdominal tenderness. No distention. No abdominal bruits. No CVA tenderness. Genitourinary:  Musculoskeletal: No lower extremity tenderness nor edema.  No joint effusions. Neurologic:  Normal speech and language. No gross focal neurologic deficits are appreciated. No gait instability. Skin:  Skin is warm, dry and intact. No rash noted. Psychiatric: Mood and affect are normal. Speech and  behavior are normal.  ____________________________________________   LABS (all labs ordered are listed, but only abnormal results are displayed)  Labs Reviewed  C DIFFICILE QUICK SCREEN W PCR REFLEX - Abnormal; Notable for the following components:      Result Value   C Diff antigen POSITIVE (*)    C Diff toxin POSITIVE (*)    All other components within normal limits  COMPREHENSIVE METABOLIC PANEL - Abnormal; Notable for the following components:   Potassium 3.3 (*)    Glucose, Bld 132 (*)    All other components within normal limits  CBC - Abnormal; Notable for the following components:   WBC 10.8 (*)    All other components within normal limits  GI PATHOGEN PANEL BY PCR, STOOL  LIPASE, BLOOD  MAGNESIUM  URINALYSIS, COMPLETE (UACMP) WITH MICROSCOPIC   ____________________________________________  EKG  ED ECG REPORT I, Tayla Panozzo, FNP-BC personally viewed and interpreted this ECG.   Date: 05/05/2019  EKG Time: 1141  Rate: 104  Rhythm: sinus tachycardia  Axis: normal  Intervals:none  ST&T Change: no ST elevation  ____________________________________________  RADIOLOGY  ED MD interpretation:    CT abdomen and pelvis with contrast shows bowel inflammation/colitis.  Official radiology report(s): CT ABDOMEN PELVIS W CONTRAST  Result Date: 05/05/2019 CLINICAL DATA:  Patient c/o light, brown liquid stool beginning on Friday and sometimes is just mucous. Patient has been recently on 2 antibiotics for dental procedures. EXAM: CT ABDOMEN AND PELVIS WITH CONTRAST TECHNIQUE: Multidetector CT imaging of the abdomen and pelvis was performed using the standard protocol following bolus administration of intravenous contrast. CONTRAST:  73m OMNIPAQUE IOHEXOL 300 MG/ML  SOLN COMPARISON:  CT abdomen pelvis 02/26/2018 FINDINGS: Lower chest: No acute abnormality. Hepatobiliary: Hepatomegaly. The liver extends across the midline. The previously described hemangioma in the posterior  right hepatic lobe is less conspicuous on today's study. No new focal liver lesion. The gallbladder is surgically absent. No new biliary duct dilation. Pancreas: Unremarkable. No pancreatic ductal dilatation or surrounding inflammatory changes. Spleen: Normal in size without focal abnormality. Adrenals/Urinary Tract: Adrenal glands are unremarkable. Kidneys are normal, without renal calculi, focal lesion, or hydronephrosis. Bladder is unremarkable. Stomach/Bowel: Stomach is unremarkable. There is chronic bowel malrotation with the colon in the a central and left abdomen. There is diffuse bowel wall thickening and enhancement throughout the colon and rectum. No dilated loops of bowel to suggest obstruction. No free air. Vascular/Lymphatic: No significant vascular findings are present. No enlarged abdominal or pelvic lymph nodes. Reproductive: Uterus and bilateral adnexa are unremarkable. Other: Trace free fluid in the pelvis. No abdominal wall abnormality. Musculoskeletal: No acute or significant osseous findings. Few scattered bone islands. IMPRESSION: 1. There is diffuse bowel wall thickening and enhancement throughout the colon and rectum, consistent with colitis, which may be infectious or inflammatory in etiology. 2. Chronic bowel malrotation. Electronically Signed   By: NAudie PintoM.D.   On: 05/05/2019 14:04    ____________________________________________   PROCEDURES  Procedure(s) performed (including Critical Care):  Procedures  ____________________________________________   INITIAL IMPRESSION / ASSESSMENT AND PLAN     69year old female presenting to the emergency department for treatment and evaluation of diarrhea.  See HPI for further details.  DIFFERENTIAL DIAGNOSIS  Diverticulitis, diverticulosis, C. difficile, colitis  ED COURSE  Labs show mild hypokalemia at 3.3 very mild leukocytosis at 10.8.  CT of the abdomen pelvis shows diffuse bowel wall thickening and enhancement  consistent with colitis of infectious or inflammatory etiology.  Plan will be to collect stool specimen to test specifically for C. Difficile.  Patient has specimen available in the room.  RN notified and asked to send to lab.  Stool specimen test positive for C. difficile.  Results were discussed with the patient.  Patient was offered admission, however she prefers to be discharged home.  She states that she has had this in the past and is well aware how to sanitize and take precautions to prevent others in her home from becoming ill as well.  This seems reasonable since her white blood cell count is less than 15,000, she is able to tolerate food and fluids, she has GI follow-up, and seems to have good support at home.  She was given strict ER return precautions.  She is to call and schedule follow-up appointment with her.  She will be given 10 days of vancomycin to be taken 4 times per day.  She was also advised to pick up a probiotic and maintain a bland, bulky diet such as bananas, rice, applesauce, and toast. ____________________________________________   FINAL CLINICAL IMPRESSION(S) / ED DIAGNOSES  Final diagnoses:  Clostridium difficile colitis     ED Discharge Orders         Ordered    vancomycin (VANCOCIN) 125 MG capsule  4 times daily     05/05/19 [place] Dana Arnold was evaluated in Emergency Department on 05/05/2019 for the symptoms described in the history of present illness. She was evaluated in the context of the global COVID-19 pandemic, which necessitated consideration that the patient might be at risk for infection with the SARS-CoV-2 virus that causes COVID-19. Institutional protocols and algorithms that pertain to the evaluation of patients at risk for COVID-19 are in a state of rapid change based on information released by regulatory bodies including the CDC and federal and state organizations. These policies and algorithms were followed during the patient's care  in the ED.   Note:  This document was prepared using Dragon voice recognition software and may include unintentional dictation errors.   Victorino Dike, FNP 05/05/19 1639    Carrie Mew, MD 05/08/19 1724

## 2019-05-05 NOTE — Discharge Instructions (Signed)
Please call and schedule follow-up appointment with her gastroenterologist.  Return to the emergency department if you are unable to tolerate food or fluids.  Bland, bulky diet recommended such as bananas, rice, applesauce, and toast.  You may also consider picking up a probiotic at the pharmacy which may help with some of the diarrhea.  Wash her hands frequently and sanitize as we discussed.

## 2019-05-08 ENCOUNTER — Telehealth: Payer: Self-pay | Admitting: Family Medicine

## 2019-05-08 LAB — GI PATHOGEN PANEL BY PCR, STOOL

## 2019-05-08 NOTE — Telephone Encounter (Signed)
Copied from CRM [id number]. Topic: General - Other >> May 08, 2019  9:00 AM Tamela Oddi wrote: Reason for CRM: Patient went to the ER Sunday and has C-diff and hemorrhoids.  She needs something that is topical for the hemorrhoids.  Please advise and patient would like to speak with the nurse or doctor.  CB# [phone]

## 2019-05-09 NOTE — Telephone Encounter (Signed)
Pt called to follow up on call from yesterday. Would still like to speak with Dr. Vivianne Master nurse and if she will receive topical rx. Please follow up with pt.

## 2019-05-13 ENCOUNTER — Telehealth: Payer: Self-pay

## 2019-05-13 NOTE — Telephone Encounter (Signed)
Copied from CRM [id number]. Topic: General - Other >> May 13, 2019 10:33 AM Fanny Bien wrote: Reason for CRM: pt called and stated that she was diagnosed with c-diff on 05/05/2019. Pt was told at ER that she could return to work after completing medication. Pt would like a call back from Tiffany the nurse. Please advise   Called patient she would like a note to return to work on Thursday. She has been asked to follow up with GI and she has appointment in April. She would like to see another GI specialist and she needs a referral. Dr. Mills Koller. She is located at [facility]. [phone] is the office number. Best number to contact patient is the number listed.

## 2019-05-13 NOTE — Telephone Encounter (Signed)
Pt called and stated that wrong date is on the return to work note. Pt states that the note states she went to the ER on 05/13/19 but needs to say 05/05/19. Please advise

## 2019-05-13 NOTE — Telephone Encounter (Signed)
Note corrected

## 2019-05-14 NOTE — Telephone Encounter (Unsigned)
Copied from CRM [id number]. Topic: General - Other >> May 13, 2019 10:33 AM Fanny Bien wrote: Reason for CRM: pt called and stated that she was diagnosed with c-diff on 05/05/2019. Pt was told at ER that she could return to work after completing medication. Pt would like a call back from Tiffany the nurse. Please advise >> May 14, 2019  2:31 PM Tamela Oddi wrote: Patient called to inform the doctor that the noted she requested has not been corrected in My Chart.  Patient states she needs this asap.  Please call if there is a problem at [phone]

## 2019-10-22 DIAGNOSIS — M1990 Unspecified osteoarthritis, unspecified site: Secondary | ICD-10-CM | POA: Insufficient documentation

## 2019-10-22 NOTE — Progress Notes (Signed)
Name: Dana Arnold   MRN: [id number]    DOB: 11/05/1950   Date:10/23/2019       Progress Note  Subjective  Chief Complaint  Chief Complaint  Patient presents with  . Follow-up    HPI  OA right knee: she has a history of intermittent pain on right knee, no effusion. She uses voltaren prn but only used twice over the past year. Doing very well    Subclinical hypothyroidism: last TSH's have been normal, no dry skin, no change is bowel movements - she always has problems with bowel movements , no difficulty swallowing. We will recheck level   Low Vitamin D level and osteopenia: we will recheck vitamin D level, discussed high calcium level. We will recheck it today   Hypoglycemia: she brought a log, she is having glucose down to 45, she has been trying to avoid refined sugars, discussed having more frequent snacks, advised to take cheese sticks for work, nuts. She can tell when her glucose is dropping , discussed food options, avoid snack bars, look at the carbohydrate content   Osteoporosis: right femur was -2.5 %, she is not taking calcium she is on vitamin D , discussed options of therapy, oral, injectables , referral to endo , she would like to think about it.   Recurrent abdominal pain:  She has a long history of gi problems, she states she states she wants to see a functional gastroenterologist in [place]. She states her bowels goes from diarrhea to constipation. She also has recurrent abdominal pain   Patient Active Problem List   Diagnosis Date Noted  . Osteoarthritis 10/22/2019  . Chronic neck pain 04/05/2019  . Arthritis 02/19/2019  . Osteoporosis, post-menopausal 01/03/2019  . Scoliosis of lumbar spine 04/22/2016  . Thoracic scoliosis 09/08/2015  . Chondromalacia 03/10/2015  . Arthritis, degenerative 12/24/2014  . Hyperglycemia 12/24/2014  . Cervical radiculitis 12/24/2014  . Subclinical hypothyroidism 12/24/2014  . Vitamin D deficiency 12/24/2014  . Hx of  Clostridium difficile infection 12/24/2014  . External hemorrhoid 12/24/2014  . Chronic gastric erosion 12/24/2014  . Chondrocalcinosis of right knee 12/24/2014  . Osteopenia 12/24/2014  . DDD (degenerative disc disease), lumbar 12/24/2014    Past Surgical History:  Procedure Laterality Date  . arthroscopic knee Right 05/26/2014  . CHOLECYSTECTOMY  02/1989  . right ulna surgery Right 07/01/2010  . TONSILLECTOMY AND ADENOIDECTOMY Bilateral 1961    Family History  Problem Relation Age of Onset  . Osteoporosis Mother   . Aneurysm Father     Social History   Tobacco Use  . Smoking status: Never Smoker  . Smokeless tobacco: Never Used  Substance Use Topics  . Alcohol use: No    Alcohol/week: 0.0 standard drinks     Current Outpatient Medications:  .  blood glucose meter kit and supplies, Dispense based on patient and insurance preference. Use up to four times daily as directed. Hypoglycemia episodes, Disp: 1 each, Rfl: 0 .  Cholecalciferol (VITAMIN D) 2000 units tablet, Take 2,000 Units by mouth daily., Disp: , Rfl:  .  Turmeric (QC TUMERIC COMPLEX) 500 MG CAPS, Take by mouth., Disp: , Rfl:  .  diclofenac sodium (VOLTAREN) 1 % GEL, Apply 4 g topically 4 (four) times daily. (Patient not taking: Reported on 10/23/2019), Disp: 100 g, Rfl: 0  Allergies  Allergen Reactions  . Macrobid [Nitrofurantoin Macrocrystal]   . Terazol 3 [Terconazole]     I personally reviewed active problem list, medication list, allergies, family history, social  history, health maintenance with the patient/caregiver today.   ROS  Constitutional: Negative for fever or weight change.  Respiratory: Negative for cough and shortness of breath.   Cardiovascular: Negative for chest pain or palpitations.  Gastrointestinal: Negative for abdominal pain, no bowel changes.  Musculoskeletal: Negative for gait problem or joint swelling.  Skin: Negative for rash.  Neurological: Negative for dizziness or  headache.  No other specific complaints in a complete review of systems (except as listed in HPI above).  Objective  Vitals:   10/23/19 1132  BP: 94/78  Pulse: 92  Resp: 14  Temp: 97.8 F (36.6 C)  TempSrc: Oral  SpO2: 98%  Weight: 126 lb 8 oz (57.4 kg)  Height: _0  (1.651 m)    Body mass index is 21.05 kg/m.  Physical Exam  Constitutional: Patient appears well-developed and well-nourished.  No distress.  HEENT: head atraumatic, normocephalic, pupils equal and reactive to light,  neck supple Cardiovascular: Normal rate, regular rhythm and normal heart sounds.  No murmur heard. No BLE edema. Pulmonary/Chest: Effort normal and breath sounds normal. No respiratory distress. Abdominal: Soft.  There is no tenderness. Psychiatric: Patient has a normal mood and affect. behavior is normal. Judgment and thought content normal.   PHQ2/9: Depression screen [facility] 2/9 10/23/2019 12/11/2018 11/27/2018 11/27/2018 12/13/2017  Decreased Interest 0 0 0 0 0  Down, Depressed, Hopeless 0 0 0 0 0  PHQ - 2 Score 0 0 0 0 0  Altered sleeping 0 0 0 0 0  Tired, decreased energy 0 0 0 0 0  Change in appetite 0 0 0 0 0  Feeling bad or failure about yourself  0 0 0 0 0  Trouble concentrating 0 0 0 0 0  Moving slowly or fidgety/restless 0 0 0 0 0  Suicidal thoughts 0 0 0 0 0  PHQ-9 Score 0 0 0 0 0  Difficult doing work/chores - - - - Not difficult at all    phq 9 is negative   Fall Risk: Fall Risk  10/23/2019 12/11/2018 11/27/2018 12/13/2017 01/31/2017  Falls in the past year? 0 0 0 No No  Number falls in past yr: 0 0 0 - -  Injury with Fall? 0 0 0 - -  Risk for fall due to : - - - - History of fall(s)  Risk for fall due to: Comment - - - - patient had a fall at the school about 7 yrs.     Functional Status Survey: Is the patient deaf or have difficulty hearing?: No Does the patient have difficulty seeing, even when wearing glasses/contacts?: No Does the patient have difficulty  concentrating, remembering, or making decisions?: No Does the patient have difficulty walking or climbing stairs?: No Does the patient have difficulty dressing or bathing?: No Does the patient have difficulty doing errands alone such as visiting a doctor's office or shopping?: No    Assessment & Plan  1. Vitamin D deficiency  - VITAMIN D 25 Hydroxy (Vit-D Deficiency, Fractures)  2. Hypoglycemia  - COMPLETE METABOLIC PANEL WITH GFR  3. Subclinical hypothyroidism  - TSH  4. Pure hypercholesterolemia  - Lipid panel - COMPLETE METABOLIC PANEL WITH GFR  5. Primary osteoarthritis of right knee   6. Localized osteoporosis without current pathological fracture  - Parathyroid hormone, intact (no Ca) - VITAMIN D 25 Hydroxy (Vit-D Deficiency, Fractures)  7. Leukocytosis, unspecified type  Recheck CBC

## 2019-10-23 ENCOUNTER — Other Ambulatory Visit: Payer: Self-pay

## 2019-10-23 ENCOUNTER — Ambulatory Visit (INDEPENDENT_AMBULATORY_CARE_PROVIDER_SITE_OTHER): Payer: Medicare PPO | Admitting: Family Medicine

## 2019-10-23 ENCOUNTER — Encounter: Payer: Self-pay | Admitting: Family Medicine

## 2019-10-23 VITALS — BP 94/78 | HR 92 | Temp 97.8°F | Resp 14 | Ht 65.0 in | Wt 126.5 lb

## 2019-10-23 DIAGNOSIS — E039 Hypothyroidism, unspecified: Secondary | ICD-10-CM | POA: Diagnosis not present

## 2019-10-23 DIAGNOSIS — E7800 Pure hypercholesterolemia, unspecified: Secondary | ICD-10-CM

## 2019-10-23 DIAGNOSIS — E162 Hypoglycemia, unspecified: Secondary | ICD-10-CM | POA: Diagnosis not present

## 2019-10-23 DIAGNOSIS — M816 Localized osteoporosis [Lequesne]: Secondary | ICD-10-CM

## 2019-10-23 DIAGNOSIS — M1711 Unilateral primary osteoarthritis, right knee: Secondary | ICD-10-CM

## 2019-10-23 DIAGNOSIS — E559 Vitamin D deficiency, unspecified: Secondary | ICD-10-CM

## 2019-10-23 DIAGNOSIS — R109 Unspecified abdominal pain: Secondary | ICD-10-CM

## 2019-10-23 DIAGNOSIS — E038 Other specified hypothyroidism: Secondary | ICD-10-CM

## 2019-10-23 DIAGNOSIS — D72829 Elevated white blood cell count, unspecified: Secondary | ICD-10-CM

## 2019-10-25 LAB — COMPLETE METABOLIC PANEL WITH GFR
AG Ratio: 1.8 (calc) (ref 1.0–2.5)
ALT: 14 U/L (ref 6–29)
AST: 21 U/L (ref 10–35)
Albumin: 3.9 g/dL (ref 3.6–5.1)
Alkaline phosphatase (APISO): 61 U/L (ref 37–153)
BUN: 15 mg/dL (ref 7–25)
CO2: 29 mmol/L (ref 20–32)
Calcium: 9.6 mg/dL (ref 8.6–10.4)
Chloride: 107 mmol/L (ref 98–110)
Creat: 0.64 mg/dL (ref 0.50–0.99)
GFR, Est African American: 108 mL/min/{1.73_m2} (ref >=60)
GFR, Est Non African American: 93 mL/min/{1.73_m2} (ref >=60)
Globulin: 2.2 g/dL (calc) (ref 1.9–3.7)
Glucose, Bld: 81 mg/dL (ref 65–99)
Potassium: 4.3 mmol/L (ref 3.5–5.3)
Sodium: 141 mmol/L (ref 135–146)
Total Bilirubin: 0.6 mg/dL (ref 0.2–1.2)
Total Protein: 6.1 g/dL (ref 6.1–8.1)

## 2019-10-25 LAB — CBC WITH DIFFERENTIAL/PLATELET
Absolute Monocytes: 446 cells/uL (ref 200–950)
Basophils Absolute: 39 cells/uL (ref 0–200)
Basophils Relative: 0.8 %
Eosinophils Absolute: 127 cells/uL (ref 15–500)
Eosinophils Relative: 2.6 %
HCT: 42.1 % (ref 35.0–45.0)
Hemoglobin: 13.8 g/dL (ref 11.7–15.5)
Lymphs Abs: 1798 cells/uL (ref 850–3900)
MCH: 31 pg (ref 27.0–33.0)
MCHC: 32.8 g/dL (ref 32.0–36.0)
MCV: 94.6 fL (ref 80.0–100.0)
MPV: 11.5 fL (ref 7.5–12.5)
Monocytes Relative: 9.1 %
Neutro Abs: 2489 cells/uL (ref 1500–7800)
Neutrophils Relative %: 50.8 %
Platelets: 200 10*3/uL (ref 140–400)
RBC: 4.45 10*6/uL (ref 3.80–5.10)
RDW: 12.2 % (ref 11.0–15.0)
Total Lymphocyte: 36.7 %
WBC: 4.9 10*3/uL (ref 3.8–10.8)

## 2019-10-25 LAB — PARATHYROID HORMONE, INTACT (NO CA): PTH: 33 pg/mL (ref 14–64)

## 2019-10-25 LAB — VITAMIN D 25 HYDROXY (VIT D DEFICIENCY, FRACTURES): Vit D, 25-Hydroxy: 53 ng/mL (ref 30–100)

## 2019-10-25 LAB — LIPID PANEL
Cholesterol: 212 mg/dL — ABNORMAL HIGH (ref <200)
HDL: 74 mg/dL (ref >=50)
LDL Cholesterol (Calc): 124 mg/dL (calc) — ABNORMAL HIGH
Non-HDL Cholesterol (Calc): 138 mg/dL (calc) — ABNORMAL HIGH (ref <130)
Total CHOL/HDL Ratio: 2.9 (calc) (ref <5.0)
Triglycerides: 51 mg/dL (ref <150)

## 2019-10-25 LAB — TSH: TSH: 3.57 mIU/L (ref 0.40–4.50)

## 2019-10-31 ENCOUNTER — Other Ambulatory Visit: Payer: Self-pay

## 2019-10-31 ENCOUNTER — Encounter: Payer: Self-pay | Admitting: Family Medicine

## 2019-10-31 DIAGNOSIS — E162 Hypoglycemia, unspecified: Secondary | ICD-10-CM

## 2019-12-20 DIAGNOSIS — M2022 Hallux rigidus, left foot: Secondary | ICD-10-CM | POA: Diagnosis not present

## 2019-12-30 DIAGNOSIS — M25551 Pain in right hip: Secondary | ICD-10-CM | POA: Diagnosis not present

## 2020-01-20 DIAGNOSIS — S161XXA Strain of muscle, fascia and tendon at neck level, initial encounter: Secondary | ICD-10-CM | POA: Diagnosis not present

## 2020-01-20 DIAGNOSIS — M25551 Pain in right hip: Secondary | ICD-10-CM | POA: Diagnosis not present

## 2020-02-11 DIAGNOSIS — H16223 Keratoconjunctivitis sicca, not specified as Sjogren's, bilateral: Secondary | ICD-10-CM | POA: Diagnosis not present

## 2020-02-26 DIAGNOSIS — Z01419 Encounter for gynecological examination (general) (routine) without abnormal findings: Secondary | ICD-10-CM | POA: Diagnosis not present

## 2020-02-26 DIAGNOSIS — Z1231 Encounter for screening mammogram for malignant neoplasm of breast: Secondary | ICD-10-CM | POA: Diagnosis not present

## 2020-02-26 DIAGNOSIS — Z6821 Body mass index (BMI) 21.0-21.9, adult: Secondary | ICD-10-CM | POA: Diagnosis not present

## 2020-02-26 DIAGNOSIS — A498 Other bacterial infections of unspecified site: Secondary | ICD-10-CM | POA: Insufficient documentation

## 2020-02-26 DIAGNOSIS — N952 Postmenopausal atrophic vaginitis: Secondary | ICD-10-CM | POA: Diagnosis not present

## 2020-03-27 DIAGNOSIS — H43811 Vitreous degeneration, right eye: Secondary | ICD-10-CM | POA: Diagnosis not present

## 2020-04-20 NOTE — Progress Notes (Unsigned)
Name: Dana Arnold   MRN: [id number]    DOB: 07/12/1950   Date:04/21/2020       Progress Note  Subjective  Chief Complaint  Follow Up  HPI  OA right knee: she has a history of intermittent pain on right knee, no effusion. She uses voltaren prn but only used twice over the past year. Doing well at this time, concerned about her hands, she has noticed some pain on both hands, thick knuckles and also some nodules. She denies stiffness.   Nasal congestion : on left side and mild dry cough, she is using otc nasal spray and seems to be helping, no fever or chills.   Subclinical hypothyroidism: last TSH was back to normal, she denies  dry skin, no change is bowel movements - she always has problems with bowel movements , no difficulty swallowing. We will recheck level andTPO today   Low Vitamin D level last level was normal, taking supplementation   Hypoglycemia: she has changed her diet , avoiding sweets, states no recent hypoglycemic episodes. We will recheck A1C today because of history of hyperglycemia. Usually drops at late afternoon , not checking it lately   Osteoporosis: right femur was -2.5 %, she is not taking calcium she is on vitamin D , discussed options of therapy, oral, injectables , referral to endo . She is on regular physical activity and vitamin D supplementation,  Discussed a calcium rich diet   Recurrent abdominal pain:  She has a long history of gi problems, she states she asked to see a functional gastroenterologist in [place] last August, we placed referral but she tried [place] water and symptoms improved so she did not call them right away, now she is waiting for their schedule to open so she can make an appointment  She states her bowels goes from diarrhea to constipation. She also has recurrent abdominal pain on right lower quadrant   Paresthesia: she has intermittent burning on both feet, states not frequent , when present usually at the end of the day, not daily or  weekly. No weakness or back pain   Patient Active Problem List   Diagnosis Date Noted  . Osteoarthritis 10/22/2019  . Chronic neck pain 04/05/2019  . Arthritis 02/19/2019  . Osteoporosis, post-menopausal 01/03/2019  . Scoliosis of lumbar spine 04/22/2016  . Thoracic scoliosis 09/08/2015  . Chondromalacia 03/10/2015  . Arthritis, degenerative 12/24/2014  . Hyperglycemia 12/24/2014  . Cervical radiculitis 12/24/2014  . Subclinical hypothyroidism 12/24/2014  . Vitamin D deficiency 12/24/2014  . Hx of Clostridium difficile infection 12/24/2014  . External hemorrhoid 12/24/2014  . Chronic gastric erosion 12/24/2014  . Chondrocalcinosis of right knee 12/24/2014  . Osteopenia 12/24/2014  . DDD (degenerative disc disease), lumbar 12/24/2014    Past Surgical History:  Procedure Laterality Date  . arthroscopic knee Right 05/26/2014  . CHOLECYSTECTOMY  02/1989  . right ulna surgery Right 07/01/2010  . TONSILLECTOMY AND ADENOIDECTOMY Bilateral 1961    Family History  Problem Relation Age of Onset  . Osteoporosis Mother   . Aneurysm Father     Social History   Tobacco Use  . Smoking status: Never Smoker  . Smokeless tobacco: Never Used  Substance Use Topics  . Alcohol use: No    Alcohol/week: 0.0 standard drinks     Current Outpatient Medications:  .  blood glucose meter kit and supplies, Dispense based on patient and insurance preference. Use up to four times daily as directed. Hypoglycemia episodes, Disp: 1 each,  Rfl: 0 .  Cholecalciferol (VITAMIN D) 2000 units tablet, Take 2,000 Units by mouth daily., Disp: , Rfl:  .  docusate sodium (COLACE) 100 MG capsule, 2 caps, Disp: , Rfl:  .  Magnesium 200 MG TABS, ($RemoveB'150mg'lLUrYXcF$ ) 2 tablets with a meal, Disp: , Rfl:  .  mometasone (ELOCON) 0.1 % cream, , Disp: , Rfl:  .  Turmeric 500 MG CAPS, Take by mouth., Disp: , Rfl:  .  diclofenac sodium (VOLTAREN) 1 % GEL, Apply 4 g topically 4 (four) times daily. (Patient not taking: No sig  reported), Disp: 100 g, Rfl: 0  Allergies  Allergen Reactions  . Macrobid [Nitrofurantoin Macrocrystal]   . Terazol 3 [Terconazole]     I personally reviewed active problem list, medication list, allergies, family history, social history, health maintenance with the patient/caregiver today.   ROS  Constitutional: Negative for fever or weight change.  Respiratory: Negative for cough and shortness of breath.   Cardiovascular: Negative for chest pain or palpitations.  Gastrointestinal:positive or abdominal pain, no bowel changes - her bowel changed about 4 years ago, colonoscopy is up to date  Musculoskeletal: Negative for gait problem or joint swelling.  Skin: Negative for rash.  Neurological: Negative for dizziness or headache.  No other specific complaints in a complete review of systems (except as listed in HPI above).  Objective  Vitals:   04/21/20 0905  BP: 110/76  Pulse: 78  Resp: 16  Temp: 98 F (36.7 C)  TempSrc: Oral  SpO2: 98%  Weight: 128 lb (58.1 kg)  Height: $Remove'5\' 5"'caefvaN$  (1.651 m)    Body mass index is 21.3 kg/m.  Physical Exam  Constitutional: Patient appears well-developed and well-nourished.  No distress.  HEENT: head atraumatic, normocephalic, pupils equal and reactive to light, e neck supple Cardiovascular: Normal rate, regular rhythm and normal heart sounds.  No murmur heard. No BLE edema. Pulmonary/Chest: Effort normal and breath sounds normal. No respiratory distress. Abdominal: Soft.  There is no tenderness. Muscular skeletal: PIP and DIP enlargement, also some nodules on fingers, no redness or increase in warmth Psychiatric: Patient has a normal mood and affect. behavior is normal. Judgment and thought content normal.  PHQ2/9: Depression screen [facility] 2/9 04/21/2020 10/23/2019 12/11/2018 11/27/2018 11/27/2018  Decreased Interest 0 0 0 0 0  Down, Depressed, Hopeless 0 0 0 0 0  PHQ - 2 Score 0 0 0 0 0  Altered sleeping - 0 0 0 0  Tired, decreased energy -  0 0 0 0  Change in appetite - 0 0 0 0  Feeling bad or failure about yourself  - 0 0 0 0  Trouble concentrating - 0 0 0 0  Moving slowly or fidgety/restless - 0 0 0 0  Suicidal thoughts - 0 0 0 0  PHQ-9 Score - 0 0 0 0  Difficult doing work/chores - - - - -    phq 9 is negative   Fall Risk: Fall Risk  04/21/2020 10/23/2019 12/11/2018 11/27/2018 12/13/2017  Falls in the past year? 0 0 0 0 No  Number falls in past yr: 0 0 0 0 -  Injury with Fall? 0 0 0 0 -  Risk for fall due to : - - - - -  Risk for fall due to: Comment - - - - -    Functional Status Survey: Is the patient deaf or have difficulty hearing?: No Does the patient have difficulty seeing, even when wearing glasses/contacts?: No Does the patient have difficulty concentrating,  remembering, or making decisions?: No Does the patient have difficulty walking or climbing stairs?: No Does the patient have difficulty dressing or bathing?: No Does the patient have difficulty doing errands alone such as visiting a doctor's office or shopping?: No    Assessment & Plan  1. Subclinical hypothyroidism  - TSH - Thyroid Peroxidase Antibodies (TPO) (REFL)  2. Hyperglycemia  - COMPLETE METABOLIC PANEL WITH GFR - Hemoglobin A1c  3. Recurrent abdominal pain  - COMPLETE METABOLIC PANEL WITH GFR  4. Primary osteoarthritis of right knee  Stable   5. Vitamin D deficiency   6. Pure hypercholesterolemia   7. Osteopenia after menopause   8. Arthralgia of both hands  - Sedimentation rate - C-reactive protein - ANA,IFA RA Diag Pnl w/rflx Tit/Patn  9. Nodule of finger of both hands  - Sedimentation rate - C-reactive protein - ANA,IFA RA Diag Pnl w/rflx Tit/Patn  10. Paresthesia of foot, bilateral  - Vitamin B12

## 2020-04-21 ENCOUNTER — Ambulatory Visit: Payer: Medicare PPO | Admitting: Family Medicine

## 2020-04-21 ENCOUNTER — Other Ambulatory Visit: Payer: Self-pay

## 2020-04-21 ENCOUNTER — Encounter: Payer: Self-pay | Admitting: Family Medicine

## 2020-04-21 VITALS — BP 110/76 | HR 78 | Temp 98.0°F | Resp 16 | Ht 65.0 in | Wt 128.0 lb

## 2020-04-21 DIAGNOSIS — R109 Unspecified abdominal pain: Secondary | ICD-10-CM

## 2020-04-21 DIAGNOSIS — M8580 Other specified disorders of bone density and structure, unspecified site: Secondary | ICD-10-CM | POA: Diagnosis not present

## 2020-04-21 DIAGNOSIS — R2233 Localized swelling, mass and lump, upper limb, bilateral: Secondary | ICD-10-CM

## 2020-04-21 DIAGNOSIS — E559 Vitamin D deficiency, unspecified: Secondary | ICD-10-CM

## 2020-04-21 DIAGNOSIS — E7800 Pure hypercholesterolemia, unspecified: Secondary | ICD-10-CM | POA: Diagnosis not present

## 2020-04-21 DIAGNOSIS — E038 Other specified hypothyroidism: Secondary | ICD-10-CM | POA: Diagnosis not present

## 2020-04-21 DIAGNOSIS — R739 Hyperglycemia, unspecified: Secondary | ICD-10-CM | POA: Diagnosis not present

## 2020-04-21 DIAGNOSIS — M25542 Pain in joints of left hand: Secondary | ICD-10-CM

## 2020-04-21 DIAGNOSIS — M1711 Unilateral primary osteoarthritis, right knee: Secondary | ICD-10-CM | POA: Diagnosis not present

## 2020-04-21 DIAGNOSIS — R202 Paresthesia of skin: Secondary | ICD-10-CM

## 2020-04-21 DIAGNOSIS — M25541 Pain in joints of right hand: Secondary | ICD-10-CM

## 2020-04-21 DIAGNOSIS — Z780 Asymptomatic menopausal state: Secondary | ICD-10-CM

## 2020-04-23 ENCOUNTER — Encounter: Payer: Self-pay | Admitting: Family Medicine

## 2020-04-27 ENCOUNTER — Encounter: Payer: Self-pay | Admitting: Family Medicine

## 2020-04-27 LAB — COMPLETE METABOLIC PANEL WITH GFR
AG Ratio: 1.6 (calc) (ref 1.0–2.5)
ALT: 16 U/L (ref 6–29)
AST: 23 U/L (ref 10–35)
Albumin: 4.1 g/dL (ref 3.6–5.1)
Alkaline phosphatase (APISO): 60 U/L (ref 37–153)
BUN: 14 mg/dL (ref 7–25)
CO2: 31 mmol/L (ref 20–32)
Calcium: 9.8 mg/dL (ref 8.6–10.4)
Chloride: 106 mmol/L (ref 98–110)
Creat: 0.66 mg/dL (ref 0.50–0.99)
GFR, Est African American: 106 mL/min/{1.73_m2} (ref >=60)
GFR, Est Non African American: 91 mL/min/{1.73_m2} (ref >=60)
Globulin: 2.5 g/dL (calc) (ref 1.9–3.7)
Glucose, Bld: 80 mg/dL (ref 65–99)
Potassium: 5.3 mmol/L (ref 3.5–5.3)
Sodium: 143 mmol/L (ref 135–146)
Total Bilirubin: 0.5 mg/dL (ref 0.2–1.2)
Total Protein: 6.6 g/dL (ref 6.1–8.1)

## 2020-04-27 LAB — VITAMIN B12: Vitamin B-12: 317 pg/mL (ref 200–1100)

## 2020-04-27 LAB — HEMOGLOBIN A1C
Hgb A1c MFr Bld: 5.7 % of total Hgb — ABNORMAL HIGH (ref <5.7)
Mean Plasma Glucose: 117 mg/dL
eAG (mmol/L): 6.5 mmol/L

## 2020-04-27 LAB — SEDIMENTATION RATE: Sed Rate: 2 mm/h (ref 0–30)

## 2020-04-27 LAB — THYROID PEROXIDASE ANTIBODIES (TPO) (REFL): Thyroperoxidase Ab SerPl-aCnc: 9 IU/mL — ABNORMAL HIGH (ref <9)

## 2020-04-27 LAB — ANA,IFA RA DIAG PNL W/RFLX TIT/PATN
Anti Nuclear Antibody (ANA): NEGATIVE
Cyclic Citrullin Peptide Ab: <16 UNITS
Rheumatoid fact SerPl-aCnc: <14 IU/mL (ref <14)

## 2020-04-27 LAB — TSH: TSH: 3.01 mIU/L (ref 0.40–4.50)

## 2020-04-27 LAB — C-REACTIVE PROTEIN: CRP: 1.5 mg/L (ref <8.0)

## 2020-05-08 DIAGNOSIS — J310 Chronic rhinitis: Secondary | ICD-10-CM | POA: Diagnosis not present

## 2020-05-08 DIAGNOSIS — H9311 Tinnitus, right ear: Secondary | ICD-10-CM | POA: Diagnosis not present

## 2020-06-17 DIAGNOSIS — M25561 Pain in right knee: Secondary | ICD-10-CM | POA: Diagnosis not present

## 2020-06-23 DIAGNOSIS — M25561 Pain in right knee: Secondary | ICD-10-CM | POA: Diagnosis not present

## 2020-06-30 DIAGNOSIS — M25561 Pain in right knee: Secondary | ICD-10-CM | POA: Diagnosis not present

## 2020-07-09 DIAGNOSIS — M25561 Pain in right knee: Secondary | ICD-10-CM | POA: Diagnosis not present

## 2020-07-21 DIAGNOSIS — S50861A Insect bite (nonvenomous) of right forearm, initial encounter: Secondary | ICD-10-CM | POA: Diagnosis not present

## 2020-07-21 DIAGNOSIS — L821 Other seborrheic keratosis: Secondary | ICD-10-CM | POA: Diagnosis not present

## 2020-07-21 DIAGNOSIS — D692 Other nonthrombocytopenic purpura: Secondary | ICD-10-CM | POA: Diagnosis not present

## 2020-07-22 DIAGNOSIS — M25561 Pain in right knee: Secondary | ICD-10-CM | POA: Diagnosis not present

## 2020-08-04 ENCOUNTER — Other Ambulatory Visit: Payer: Self-pay | Admitting: Orthopedic Surgery

## 2020-08-04 DIAGNOSIS — W010XXA Fall on same level from slipping, tripping and stumbling without subsequent striking against object, initial encounter: Secondary | ICD-10-CM | POA: Diagnosis not present

## 2020-08-04 DIAGNOSIS — S72052A Unspecified fracture of head of left femur, initial encounter for closed fracture: Secondary | ICD-10-CM | POA: Diagnosis not present

## 2020-08-04 DIAGNOSIS — S72012A Unspecified intracapsular fracture of left femur, initial encounter for closed fracture: Secondary | ICD-10-CM | POA: Diagnosis not present

## 2020-08-04 DIAGNOSIS — M25552 Pain in left hip: Secondary | ICD-10-CM | POA: Diagnosis not present

## 2020-08-04 DIAGNOSIS — S72002A Fracture of unspecified part of neck of left femur, initial encounter for closed fracture: Secondary | ICD-10-CM | POA: Diagnosis not present

## 2020-08-04 DIAGNOSIS — S72045A Nondisplaced fracture of base of neck of left femur, initial encounter for closed fracture: Secondary | ICD-10-CM | POA: Diagnosis not present

## 2020-08-05 ENCOUNTER — Other Ambulatory Visit: Payer: Medicare PPO | Attending: Orthopedic Surgery

## 2020-08-06 ENCOUNTER — Encounter
Admission: RE | Admit: 2020-08-06 | Discharge: 2020-08-06 | Disposition: A | Discharge status: Home / Self Care | Payer: Medicare PPO | Source: Ambulatory Visit | Attending: Orthopedic Surgery | Admitting: Orthopedic Surgery

## 2020-08-06 ENCOUNTER — Other Ambulatory Visit: Payer: Self-pay

## 2020-08-06 MED ORDER — CEFAZOLIN SODIUM-DEXTROSE 2-4 GM/100ML-% IV SOLN
2.0000 g | INTRAVENOUS | Status: AC
  Administered 2020-08-07: 2 g via INTRAVENOUS

## 2020-08-06 MED ORDER — ORAL CARE MOUTH RINSE: 15.0000 mL | Freq: Once | OROMUCOSAL | Status: AC

## 2020-08-06 MED ORDER — FAMOTIDINE 20 MG PO TABS
20.0000 mg | ORAL_TABLET | Freq: Once | ORAL | Status: AC
  Administered 2020-08-07: 20 mg via ORAL

## 2020-08-06 MED ORDER — CHLORHEXIDINE GLUCONATE 0.12 % MT SOLN
15.0000 mL | Freq: Once | OROMUCOSAL | Status: AC
  Administered 2020-08-07: 15 mL via OROMUCOSAL

## 2020-08-06 MED ORDER — LACTATED RINGERS IV SOLN: INTRAVENOUS | Status: DC

## 2020-08-06 NOTE — Patient Instructions (Signed)
Your procedure is scheduled on: 08/07/20 Report to DAY SURGERY DEPARTMENT LOCATED ON 2ND FLOOR MEDICAL MALL ENTRANCE. To find out your arrival time please call [phone] between 1PM - 3PM on 08/06/20.  Remember: Instructions that are not followed completely may result in serious medical risk, up to and including death, or upon the discretion of your surgeon and anesthesiologist your surgery may need to be rescheduled.     _X__ 1. Do not eat food after midnight the night before your procedure.                 No gum chewing or hard candies. You may drink clear liquids up to 2 hours                 before you are scheduled to arrive for your surgery- DO not drink clear                 liquids within 2 hours of the start of your surgery.                 Clear Liquids include:  water, apple juice without pulp, clear carbohydrate                 drink such as Clearfast or Gatorade, Black Coffee or Tea (Do not add                 anything to coffee or tea). Diabetics water only  __X__2.  On the morning of surgery brush your teeth with toothpaste and water, you                 may rinse your mouth with mouthwash if you wish.  Do not swallow any              toothpaste of mouthwash.     _X__ 3.  No Alcohol for 24 hours before or after surgery.   _X__ 4.  Do Not Smoke or use e-cigarettes For 24 Hours Prior to Your Surgery.                 Do not use any chewable tobacco products for at least 6 hours prior to                 surgery.  ____  5.  Bring all medications with you on the day of surgery if instructed.   __X__  6.  Notify your doctor if there is any change in your medical condition      (cold, fever, infections).     Do not wear jewelry, make-up or eye makeup, hairpins, clips or nail polish or toenail polish. Do not wear lotions, powders, or perfumes.  Do not shave 48 hours prior to surgery. Men may shave face and neck. Do not bring valuables to the hospital.    [facility] is not  responsible for any belongings or valuables.  Contacts, dentures/partials or body piercings may not be worn into surgery. Bring a case for your contacts, glasses or hearing aids, a denture cup will be supplied. Leave your suitcase in the car. After surgery it may be brought to your room. For patients admitted to the hospital, discharge time is determined by your treatment team.   Patients discharged the day of surgery will not be allowed to drive home.   Please read over the following fact sheets that you were given:     __X__ Take these medicines the morning of surgery with A  SIP OF WATER:    1. None (you may use Flonase if needed)  2.   3.   4.  5.  6.  ____ Fleet Enema (as directed)   ____ Use CHG Soap/SAGE wipes as directed  ____ Use inhalers on the day of surgery  ____ Stop metformin/Janumet/Farxiga 2 days prior to surgery    ____ Take 1/2 of usual insulin dose the night before surgery. No insulin the morning          of surgery.   ____ Stop Blood Thinners Coumadin/Plavix/Xarelto/Pleta/Pradaxa/Eliquis/Effient/Aspirin  on   Or contact your Surgeon, Cardiologist or Medical Doctor regarding  ability to stop your blood thinners  __X__ Stop Anti-inflammatories 7 days before surgery such as Advil, Ibuprofen, Motrin,  BC or Goodies Powder, Naprosyn, Naproxen, Aleve, Aspirin    __X__ Stop all herbal supplements, fish oil or vitamin E until after surgery.    ____ Bring C-Pap to the hospital.

## 2020-08-07 ENCOUNTER — Inpatient Hospital Stay: Payer: Medicare PPO

## 2020-08-07 ENCOUNTER — Encounter: Payer: Self-pay | Admitting: Orthopedic Surgery

## 2020-08-07 ENCOUNTER — Inpatient Hospital Stay: Payer: Medicare PPO | Admitting: Anesthesiology

## 2020-08-07 ENCOUNTER — Encounter
Admission: RE | Disposition: A | Discharge status: Home / Self Care | Payer: Self-pay | Source: Home / Self Care | Attending: Orthopedic Surgery

## 2020-08-07 ENCOUNTER — Ambulatory Visit
Admission: RE | Admit: 2020-08-07 | Discharge: 2020-08-07 | Disposition: A | Discharge status: Home / Self Care | Payer: Medicare PPO | Attending: Orthopedic Surgery | Admitting: Orthopedic Surgery

## 2020-08-07 DIAGNOSIS — R2689 Other abnormalities of gait and mobility: Secondary | ICD-10-CM | POA: Insufficient documentation

## 2020-08-07 DIAGNOSIS — Z20822 Contact with and (suspected) exposure to covid-19: Secondary | ICD-10-CM | POA: Diagnosis not present

## 2020-08-07 DIAGNOSIS — S72002A Fracture of unspecified part of neck of left femur, initial encounter for closed fracture: Secondary | ICD-10-CM | POA: Diagnosis not present

## 2020-08-07 DIAGNOSIS — M25552 Pain in left hip: Secondary | ICD-10-CM | POA: Diagnosis not present

## 2020-08-07 DIAGNOSIS — Z888 Allergy status to other drugs, medicaments and biological substances status: Secondary | ICD-10-CM | POA: Insufficient documentation

## 2020-08-07 DIAGNOSIS — S72092A Other fracture of head and neck of left femur, initial encounter for closed fracture: Secondary | ICD-10-CM | POA: Diagnosis not present

## 2020-08-07 DIAGNOSIS — Z881 Allergy status to other antibiotic agents status: Secondary | ICD-10-CM | POA: Diagnosis not present

## 2020-08-07 DIAGNOSIS — Z79899 Other long term (current) drug therapy: Secondary | ICD-10-CM | POA: Diagnosis not present

## 2020-08-07 DIAGNOSIS — W19XXXA Unspecified fall, initial encounter: Secondary | ICD-10-CM | POA: Insufficient documentation

## 2020-08-07 DIAGNOSIS — Z419 Encounter for procedure for purposes other than remedying health state, unspecified: Secondary | ICD-10-CM

## 2020-08-07 DIAGNOSIS — S72012A Unspecified intracapsular fracture of left femur, initial encounter for closed fracture: Secondary | ICD-10-CM | POA: Diagnosis not present

## 2020-08-07 DIAGNOSIS — S72045A Nondisplaced fracture of base of neck of left femur, initial encounter for closed fracture: Secondary | ICD-10-CM | POA: Diagnosis not present

## 2020-08-07 HISTORY — PX: HIP PINNING,CANNULATED: SHX1758

## 2020-08-07 LAB — ABO/RH: ABO/RH(D): O POS

## 2020-08-07 LAB — TYPE AND SCREEN
ABO/RH(D): O POS
Antibody Screen: NEGATIVE

## 2020-08-07 LAB — HEMOGLOBIN AND HEMATOCRIT, BLOOD
HCT: 38.7 % (ref 36.0–46.0)
Hemoglobin: 12.6 g/dL (ref 12.0–15.0)

## 2020-08-07 LAB — SARS CORONAVIRUS 2 BY RT PCR (HOSPITAL ORDER, PERFORMED IN ~~LOC~~ HOSPITAL LAB): SARS Coronavirus 2: NEGATIVE

## 2020-08-07 SURGERY — FIXATION, FEMUR, NECK, PERCUTANEOUS, USING SCREW
Anesthesia: General | Site: Hip | Laterality: Left

## 2020-08-07 MED ORDER — CHLORHEXIDINE GLUCONATE 0.12 % MT SOLN
OROMUCOSAL | Status: AC
  Filled 2020-08-07: qty 15

## 2020-08-07 MED ORDER — ACETAMINOPHEN 500 MG PO TABS: 1000.0000 mg | ORAL_TABLET | Freq: Three times a day (TID) | ORAL | 2 refills | Status: DC

## 2020-08-07 MED ORDER — MEPERIDINE HCL 25 MG/ML IJ SOLN: 6.2500 mg | INTRAMUSCULAR | Status: DC | PRN

## 2020-08-07 MED ORDER — MIDAZOLAM HCL 2 MG/2ML IJ SOLN
INTRAMUSCULAR | Status: AC
  Filled 2020-08-07: qty 2

## 2020-08-07 MED ORDER — PROMETHAZINE HCL 25 MG/ML IJ SOLN
6.2500 mg | INTRAMUSCULAR | Status: DC | PRN
  Administered 2020-08-07: 6.25 mg via INTRAVENOUS

## 2020-08-07 MED ORDER — FENTANYL CITRATE (PF) 100 MCG/2ML IJ SOLN
INTRAMUSCULAR | Status: AC
  Filled 2020-08-07: qty 2

## 2020-08-07 MED ORDER — LACTATED RINGERS IV BOLUS
500.0000 mL | Freq: Once | INTRAVENOUS | Status: AC
  Administered 2020-08-07: 500 mL via INTRAVENOUS

## 2020-08-07 MED ORDER — FENTANYL CITRATE (PF) 100 MCG/2ML IJ SOLN: 25.0000 ug | INTRAMUSCULAR | Status: DC | PRN

## 2020-08-07 MED ORDER — ASPIRIN EC 325 MG PO TBEC: 325.0000 mg | DELAYED_RELEASE_TABLET | Freq: Every day | ORAL | 0 refills | Status: AC

## 2020-08-07 MED ORDER — PROMETHAZINE HCL 25 MG/ML IJ SOLN
INTRAMUSCULAR | Status: AC
  Filled 2020-08-07: qty 1

## 2020-08-07 MED ORDER — OXYCODONE HCL 5 MG PO TABS: 5.0000 mg | ORAL_TABLET | Freq: Once | ORAL | Status: DC | PRN

## 2020-08-07 MED ORDER — NEOMYCIN-POLYMYXIN B GU 40-200000 IR SOLN
Status: AC
  Filled 2020-08-07: qty 2

## 2020-08-07 MED ORDER — FAMOTIDINE 20 MG PO TABS
ORAL_TABLET | ORAL | Status: AC
  Filled 2020-08-07: qty 1

## 2020-08-07 MED ORDER — SODIUM CHLORIDE (PF) 0.9 % IJ SOLN
INTRAMUSCULAR | Status: AC
  Filled 2020-08-07: qty 50

## 2020-08-07 MED ORDER — MIDAZOLAM HCL 2 MG/2ML IJ SOLN
INTRAMUSCULAR | Status: DC | PRN
  Administered 2020-08-07 (×2): 1 mg via INTRAVENOUS

## 2020-08-07 MED ORDER — PROPOFOL 10 MG/ML IV BOLUS
INTRAVENOUS | Status: DC | PRN
  Administered 2020-08-07: 150 mg via INTRAVENOUS

## 2020-08-07 MED ORDER — FENTANYL CITRATE (PF) 100 MCG/2ML IJ SOLN
INTRAMUSCULAR | Status: DC | PRN
  Administered 2020-08-07 (×2): 50 ug via INTRAVENOUS

## 2020-08-07 MED ORDER — CEFAZOLIN SODIUM-DEXTROSE 2-4 GM/100ML-% IV SOLN
INTRAVENOUS | Status: AC
  Filled 2020-08-07: qty 100

## 2020-08-07 MED ORDER — OXYCODONE HCL 5 MG/5ML PO SOLN
5.0000 mg | Freq: Once | ORAL | Status: DC | PRN
Start: 2020-08-07 — End: 2020-08-07

## 2020-08-07 MED ORDER — ACETAMINOPHEN 10 MG/ML IV SOLN
INTRAVENOUS | Status: DC | PRN
  Administered 2020-08-07: 1000 mg via INTRAVENOUS

## 2020-08-07 MED ORDER — BUPIVACAINE LIPOSOME 1.3 % IJ SUSP
INTRAMUSCULAR | Status: DC | PRN
  Administered 2020-08-07: 30 mL

## 2020-08-07 MED ORDER — ACETAMINOPHEN 10 MG/ML IV SOLN
INTRAVENOUS | Status: AC
  Filled 2020-08-07: qty 100

## 2020-08-07 MED ORDER — BUPIVACAINE HCL (PF) 0.5 % IJ SOLN
INTRAMUSCULAR | Status: AC
  Filled 2020-08-07: qty 30

## 2020-08-07 MED ORDER — BUPIVACAINE LIPOSOME 1.3 % IJ SUSP
INTRAMUSCULAR | Status: AC
  Filled 2020-08-07: qty 20

## 2020-08-07 MED ORDER — KETOROLAC TROMETHAMINE 30 MG/ML IJ SOLN
INTRAMUSCULAR | Status: DC | PRN
  Administered 2020-08-07: 30 mg via INTRAVENOUS

## 2020-08-07 MED ORDER — DEXAMETHASONE SODIUM PHOSPHATE 10 MG/ML IJ SOLN
INTRAMUSCULAR | Status: DC | PRN
  Administered 2020-08-07: 10 mg via INTRAVENOUS

## 2020-08-07 MED ORDER — PHENYLEPHRINE HCL (PRESSORS) 10 MG/ML IV SOLN
INTRAVENOUS | Status: DC | PRN
  Administered 2020-08-07 (×7): 100 ug via INTRAVENOUS

## 2020-08-07 MED ORDER — HYDROCODONE-ACETAMINOPHEN 5-325 MG PO TABS: 1.0000 | ORAL_TABLET | ORAL | 0 refills | Status: DC | PRN

## 2020-08-07 MED ORDER — SODIUM CHLORIDE 0.9 % IR SOLN
Status: DC | PRN
  Administered 2020-08-07: 500 mL

## 2020-08-07 MED ORDER — ONDANSETRON HCL 4 MG/2ML IJ SOLN
INTRAMUSCULAR | Status: DC | PRN
  Administered 2020-08-07: 4 mg via INTRAVENOUS

## 2020-08-07 MED ORDER — ONDANSETRON 4 MG PO TBDP: 4.0000 mg | ORAL_TABLET | Freq: Three times a day (TID) | ORAL | 0 refills | Status: DC | PRN

## 2020-08-07 MED ORDER — LIDOCAINE HCL (CARDIAC) PF 100 MG/5ML IV SOSY
PREFILLED_SYRINGE | INTRAVENOUS | Status: DC | PRN
  Administered 2020-08-07: 100 mg via INTRAVENOUS

## 2020-08-07 MED ORDER — SODIUM CHLORIDE 0.9 % IV SOLN
INTRAVENOUS | Status: DC | PRN
  Administered 2020-08-07: 30 ug/min via INTRAVENOUS

## 2020-08-07 MED ORDER — SODIUM CHLORIDE FLUSH 0.9 % IV SOLN
INTRAVENOUS | Status: AC
  Filled 2020-08-07: qty 10

## 2020-08-07 SURGICAL SUPPLY — 51 items
APL PRP STRL LF DISP 70% ISPRP (MISCELLANEOUS) ×1
BIT DRILL 4.9 CANNULATED (BIT) ×1
BIT DRILL CANN QC 4.9 LRG (BIT) IMPLANT
BLADE SURG 15 STRL LF DISP TIS (BLADE) ×1 IMPLANT
BLADE SURG 15 STRL SS (BLADE) ×2
CANISTER SUCT 1200ML W/VALVE (MISCELLANEOUS) ×2 IMPLANT
CHLORAPREP W/TINT 26 (MISCELLANEOUS) ×2 IMPLANT
COVER LIGHT HANDLE STERIS (MISCELLANEOUS) ×1 IMPLANT
COVER WAND RF STERILE (DRAPES) ×2 IMPLANT
DRAPE 3/4 80X56 (DRAPES) ×2 IMPLANT
DRAPE SURG 17X11 SM STRL (DRAPES) ×4 IMPLANT
DRAPE U-SHAPE 47X51 STRL (DRAPES) ×4 IMPLANT
DRILL BIT CANNULATED 4.9 (BIT) ×2
DRSG AQUACEL AG ADV 3.5X10 (GAUZE/BANDAGES/DRESSINGS) ×1 IMPLANT
DRSG OPSITE POSTOP 3X4 (GAUZE/BANDAGES/DRESSINGS) ×1 IMPLANT
DRSG TEGADERM 4X4.75 (GAUZE/BANDAGES/DRESSINGS) ×1 IMPLANT
ELECT REM PT RETURN 9FT ADLT (ELECTROSURGICAL) ×2
ELECTRODE REM PT RTRN 9FT ADLT (ELECTROSURGICAL) ×1 IMPLANT
GAUZE XEROFORM 1X8 LF (GAUZE/BANDAGES/DRESSINGS) ×2 IMPLANT
GLOVE SRG 8 PF TXTR STRL LF DI (GLOVE) ×1 IMPLANT
GLOVE SURG SYN 7.5  E (GLOVE) ×4
GLOVE SURG SYN 7.5 E (GLOVE) ×2 IMPLANT
GLOVE SURG SYN 7.5 PF PI (GLOVE) ×2 IMPLANT
GLOVE SURG UNDER POLY LF SZ8 (GLOVE) ×2
GOWN STRL REUS W/ TWL LRG LVL3 (GOWN DISPOSABLE) ×1 IMPLANT
GOWN STRL REUS W/ TWL XL LVL3 (GOWN DISPOSABLE) ×1 IMPLANT
GOWN STRL REUS W/TWL LRG LVL3 (GOWN DISPOSABLE) ×2
GOWN STRL REUS W/TWL XL LVL3 (GOWN DISPOSABLE) ×2
GUIDEWIRE ASNIS 3.2 NONCAL (WIRE) ×4 IMPLANT
KIT TURNOVER CYSTO (KITS) ×2 IMPLANT
MANIFOLD NEPTUNE II (INSTRUMENTS) ×2 IMPLANT
MAT ABSORB  FLUID 56X50 GRAY (MISCELLANEOUS) ×4
MAT ABSORB FLUID 56X50 GRAY (MISCELLANEOUS) ×2 IMPLANT
NDL FILTER BLUNT 18X1 1/2 (NEEDLE) ×1 IMPLANT
NEEDLE FILTER BLUNT 18X 1/2SAF (NEEDLE) ×1
NEEDLE FILTER BLUNT 18X1 1/2 (NEEDLE) ×1 IMPLANT
NEEDLE HYPO 22GX1.5 SAFETY (NEEDLE) ×2 IMPLANT
NS IRRIG 500ML POUR BTL (IV SOLUTION) ×2 IMPLANT
PACK HIP COMPR (MISCELLANEOUS) ×2 IMPLANT
PENCIL ELECTRO HAND CTR (MISCELLANEOUS) ×2 IMPLANT
SCREW ASNIS 85MM (Screw) ×2 IMPLANT
SCREW ASNIS 90MM (Screw) ×1 IMPLANT
SLEEVE PROTECTION STRL DISP (MISCELLANEOUS) ×1 IMPLANT
STAPLER SKIN PROX 35W (STAPLE) ×2 IMPLANT
SUT VIC AB 0 CT1 36 (SUTURE) ×2 IMPLANT
SUT VIC AB 2-0 CT1 27 (SUTURE) ×2
SUT VIC AB 2-0 CT1 TAPERPNT 27 (SUTURE) ×1 IMPLANT
SYR 30ML LL (SYRINGE) ×2 IMPLANT
SYR 5ML LL (SYRINGE) ×2 IMPLANT
TAPE CLOTH 3X10 WHT NS LF (GAUZE/BANDAGES/DRESSINGS) ×2 IMPLANT
WASHER SCREW MATTA SS 13.0X1.5 (Washer) ×3 IMPLANT

## 2020-08-07 NOTE — Anesthesia Preprocedure Evaluation (Signed)
Anesthesia Evaluation  Patient identified by MRN, date of birth, ID band Patient awake    Reviewed: Allergy & Precautions, NPO status , Patient's Chart, lab work & pertinent test results  History of Anesthesia Complications Negative for: history of anesthetic complications  Airway Mallampati: II  TM Distance: >3 FB Neck ROM: Full    Dental  (+) Implants   Pulmonary neg pulmonary ROS, neg sleep apnea, neg COPD,    breath sounds clear to auscultation- rhonchi (-) wheezing      Cardiovascular Exercise Tolerance: Good (-) hypertension(-) CAD, (-) Past MI, (-) Cardiac Stents and (-) CABG  Rhythm:Regular Rate:Normal - Systolic murmurs and - Diastolic murmurs    Neuro/Psych neg Seizures negative neurological ROS  negative psych ROS   GI/Hepatic Neg liver ROS, PUD, GERD  ,  Endo/Other  neg diabetesHypothyroidism   Renal/GU negative Renal ROS     Musculoskeletal  (+) Arthritis ,   Abdominal (+) - obese,   Peds  Hematology negative hematology ROS (+)   Anesthesia Other Findings Past Medical History: No date: Cervical radiculitis No date: Cervicalgia No date: Chondromalacia No date: Chronic constipation No date: Degeneration of lumbar or lumbosacral intervertebral disc No date: External hemorrhoid, thrombosed No date: Gastritis, chronic No date: GERD (gastroesophageal reflux disease) No date: Hyperglycemia No date: Hyperlipidemia No date: Lupus anticoagulant positive No date: Olecranon bursitis No date: Osteopenia No date: Ovarian failure No date: Paresthesia No date: Vitamin D deficiency   Reproductive/Obstetrics                             Anesthesia Physical Anesthesia Plan  ASA: 2  Anesthesia Plan: General   Post-op Pain Management:    Induction: Intravenous  PONV Risk Score and Plan: 2 and Ondansetron and Dexamethasone  Airway Management Planned: Oral ETT  Additional  Equipment:   Intra-op Plan:   Post-operative Plan: Extubation in OR  Informed Consent: I have reviewed the patients History and Physical, chart, labs and discussed the procedure including the risks, benefits and alternatives for the proposed anesthesia with the patient or authorized representative who has indicated his/her understanding and acceptance.     Dental advisory given  Plan Discussed with: CRNA and Anesthesiologist  Anesthesia Plan Comments:         Anesthesia Quick Evaluation

## 2020-08-07 NOTE — Anesthesia Postprocedure Evaluation (Signed)
Anesthesia Post Note  Patient: Dana Arnold  Procedure(s) Performed: Left hip percutaneous pinning (Left: Hip)  Patient location during evaluation: PACU Anesthesia Type: General Level of consciousness: awake and alert and oriented Pain management: pain level controlled Vital Signs Assessment: post-procedure vital signs reviewed and stable Respiratory status: spontaneous breathing, nonlabored ventilation and respiratory function stable Cardiovascular status: blood pressure returned to baseline and stable Postop Assessment: no signs of nausea or vomiting Anesthetic complications: no   No notable events documented.   Last Vitals:  Vitals:   08/07/20 1425 08/07/20 1430  BP:  125/75  Pulse: 76 73  Resp: 13 12  Temp:  (!) 36.3 C  SpO2: 99% 99%    Last Pain:  Vitals:   08/07/20 1430  TempSrc:   PainSc: 0-No pain                 Eaton Folmar

## 2020-08-07 NOTE — Evaluation (Signed)
Occupational Therapy Evaluation Patient Details Name: Dana Arnold MRN: [id number] DOB: 1950-12-28 Today's Date: 08/07/2020    History of Present Illness Dana Arnold is a 70 y.o. female who sustained a valgus impacted femoral neck fracture after a fall. S/P Percutaneous pinning of Left femoral neck fracture 08/07/20.   Clinical Impression   [name] was seen for OT evaluation this date. Prior to hospital admission, pt was Independent for mobility and ADLs including caring for her 5yo grandson. Pt lives with husband in 1 level home c 1 STE. Pt presents to acute OT demonstrating impaired ADL performance and functional mobility 2/2 decreased activity tolerance and functional balance deficits. Pt states she "feels loopy" but denies pain.   Pt currently requires Supervision toileting at standard toilet and hand washing standing sink side. MIN A don LBD at bed level, assist to thread over feet, bridges w/o assist. CGA + RW for ADL t/f. Pt has excellent family support and all DME needs at home, no pain limitations this date. Pt has no further skilled acute OT needs, will sign off at this time. Upon hospital discharge, anticipate no OT follow up needs.   Seated: BP 121/106, MAP 112, HR 81 (some UE movement noted during BP reading) Standing: BP 105/70, MAP 83. Asymptomatic    Follow Up Recommendations  No OT follow up;Supervision - Intermittent    Equipment Recommendations  None recommended by OT    Recommendations for Other Services       Precautions / Restrictions Precautions Precautions: Fall Restrictions Weight Bearing Restrictions: Yes LLE Weight Bearing: Partial weight bearing LLE Partial Weight Bearing Percentage or Pounds: 50      Mobility Bed Mobility Overal bed mobility: Needs Assistance Bed Mobility: Supine to Sit;Sit to Supine     Supine to sit: Supervision Sit to supine: Supervision        Transfers Overall transfer level: Needs assistance Equipment used: Rolling  walker (2 wheeled) Transfers: Sit to/from Stand Sit to Stand: Min guard         General transfer comment: VCs to push up from bed    Balance Overall balance assessment: Needs assistance Sitting-balance support: No upper extremity supported;Feet supported Sitting balance-Leahy Scale: Good     Standing balance support: No upper extremity supported;During functional activity Standing balance-Leahy Scale: Fair Standing balance comment: steady reaching inside BOS                           ADL either performed or assessed with clinical judgement   ADL Overall ADL's : Needs assistance/impaired                                       General ADL Comments: Supervision toileting at standard toilet and hand washing standing sink side. MIN A don LBD at bed level, assist to thread over feet, bridges w/o assist. CGA + RW for ADL t/f      Pertinent Vitals/Pain Pain Assessment: No/denies pain     Hand Dominance Right   Extremity/Trunk Assessment Upper Extremity Assessment Upper Extremity Assessment: Overall WFL for tasks assessed   Lower Extremity Assessment Lower Extremity Assessment: Generalized weakness       Communication Communication Communication: No difficulties   Cognition Arousal/Alertness: Awake/alert Behavior During Therapy: WFL for tasks assessed/performed Overall Cognitive Status: Within Functional Limits for tasks assessed  General Comments: Answers questions accurately, states she feels "loopy."   General Comments  Seated: BP 121/106, MAP 112, HR 81 (some UE movement noted during BP reading). Standing: BP 105/70, MAP 83. Asymptomatic    Exercises Exercises: Other exercises Other Exercises Other Exercises: Pt educated re: OT role, DME recs, d/c recs, falls prevention, ECS, home/routines modifications, adapted dressing/bathing techniques Other Exercises: LBD, toileting, sup<>sit,  sit<>stand, sitting/standing balance/tolerance, ~50 ft mobility   Shoulder Instructions      Home Living Family/patient expects to be discharged to:: Private residence Living Arrangements: Spouse/significant other Available Help at Discharge: Family Type of Home: House Home Access: Stairs to enter Secretary/administrator of Steps: 1   Home Layout: One level               Home Equipment: Shower seat;Bedside commode;Walker - 2 wheels          Prior Functioning/Environment Level of Independence: Independent        Comments: no AD, cares for 5yo grandson        OT Problem List: Decreased activity tolerance;Impaired balance (sitting and/or standing);Decreased safety awareness         OT Goals(Current goals can be found in the care plan section) Acute Rehab OT Goals Patient Stated Goal: To return to PLOF OT Goal Formulation: With patient/family Time For Goal Achievement: 08/21/20 Potential to Achieve Goals: Good   AM-PAC OT "6 Clicks" Daily Activity     Outcome Measure Help from another person eating meals?: None Help from another person taking care of personal grooming?: None Help from another person toileting, which includes using toliet, bedpan, or urinal?: A Little Help from another person bathing (including washing, rinsing, drying)?: A Little Help from another person to put on and taking off regular upper body clothing?: None Help from another person to put on and taking off regular lower body clothing?: A Little 6 Click Score: 21   End of Session Equipment Utilized During Treatment: Gait belt;Rolling walker Nurse Communication: Mobility status  Activity Tolerance: Patient tolerated treatment well Patient left: with family/visitor present;with nursing/sitter in room (up with PT in room)  OT Visit Diagnosis: Other abnormalities of gait and mobility (R26.89)                Time: 1530-1550 OT Time Calculation (min): 20 min Charges:  OT General Charges $OT  Visit: 1 Visit OT Evaluation $OT Eval Low Complexity: 1 Low  Kathie Dike, M.S. OTR/L  08/07/20, 3:57 PM  ascom [phone]

## 2020-08-07 NOTE — H&P (Signed)
Paper H&P to be scanned into permanent record. H&P reviewed. No significant changes noted.  

## 2020-08-07 NOTE — Discharge Instructions (Addendum)
AMBULATORY SURGERY  DISCHARGE INSTRUCTIONS   The drugs that you were given will stay in your system until tomorrow so for the next 24 hours you should not:  Drive an automobile Make any legal decisions Drink any alcoholic beverage   You may resume regular meals tomorrow.  Today it is better to start with liquids and gradually work up to solid foods.  You may eat anything you prefer, but it is better to start with liquids, then soup and crackers, and gradually work up to solid foods.   Please notify your doctor immediately if you have any unusual bleeding, trouble breathing, redness and pain at the surgery site, drainage, fever, or pain not relieved by medication.    Additional Instructions:        Please contact your physician with any problems or Same Day Surgery at [phone], Monday through Friday 6 am to 4 pm, or [place] [facility] [place] Main number at [phone]. AMBULATORY SURGERY  DISCHARGE INSTRUCTIONS   The drugs that you were given will stay in your system until tomorrow so for the next 24 hours you should not:  Drive an automobile Make any legal decisions Drink any alcoholic beverage   You may resume regular meals tomorrow.  Today it is better to start with liquids and gradually work up to solid foods.  You may eat anything you prefer, but it is better to start with liquids, then soup and crackers, and gradually work up to solid foods.   Please notify your doctor immediately if you have any unusual bleeding, trouble breathing, redness and pain at the surgery site, drainage, fever, or pain not relieved by medication.    Additional Instructions:        Please contact your physician with any problems or Same Day Surgery at [phone], Monday through Friday 6 am to 4 pm, or [place] at  Main number at [phone].  

## 2020-08-07 NOTE — Progress Notes (Signed)
Physical Therapy Treatment Patient Details Name: Dana Arnold MRN: [id number] DOB: [birth year] Today's Date: 08/07/2020    History of Present Illness Dana Arnold is a 70 y.o. female who sustained a valgus impacted femoral neck fracture after a fall. Pt is s/p percutaneous pinning of L femoral neck fx & now 50% PWB on LLE.    PT Comments    Pt seen for PT evaluation with co-tx with OT for initial portion of session. PT educates pt on weight bearing status and provides pt with HEP handout. Pt is able to perform all exercises with instructional cuing for technique. Pt is able to ambulate to bathroom with RW & CGA fade to supervision. Pt negotiates single step backwards with RW & CGA. Verbally educated pt & husband on car transfer (sit then transfer BLE in/out vs stepping in). Pt & husband voice understanding of all education & report they have already been set up with OPPT this coming Tuesday.     Follow Up Recommendations  Outpatient PT     Equipment Recommendations  None recommended by PT (pt already had RW, BSC & w/c)    Recommendations for Other Services       Precautions / Restrictions Precautions Precautions: Fall Restrictions Weight Bearing Restrictions: Yes LLE Weight Bearing: Partial weight bearing LLE Partial Weight Bearing Percentage or Pounds: 50%    Mobility  Bed Mobility Overal bed mobility: Needs Assistance Bed Mobility: Supine to Sit;Sit to Supine     Supine to sit: Supervision Sit to supine: Supervision        Transfers Overall transfer level: Needs assistance Equipment used: Rolling walker (2 wheeled) Transfers: Sit to/from Stand Sit to Stand: Min guard;Supervision (min guard fade to supervision)         General transfer comment: VCs to push up from bed  Ambulation/Gait Ambulation/Gait assistance: Min guard;Supervision (CGA fade to supervision) Gait Distance (Feet): 20 Feet (x 3 trials) Assistive device: Rolling walker (2 wheeled) Gait  Pattern/deviations: Decreased step length - left;Decreased stride length;Decreased weight shift to left Gait velocity: decreased       Stairs Stairs: Yes Stairs assistance: Min guard Stair Management: No rails;With walker Number of Stairs: 1 General stair comments: backwards with RW with compensatory pattern after PT provides education & demonstration   Wheelchair Mobility    Modified Rankin (Stroke Patients Only)       Balance Overall balance assessment: Needs assistance Sitting-balance support: No upper extremity supported;Feet supported Sitting balance-Leahy Scale: Good     Standing balance support: No upper extremity supported;During functional activity Standing balance-Leahy Scale: Fair Standing balance comment: steady reaching inside BOS                            Cognition Arousal/Alertness: Awake/alert Behavior During Therapy: WFL for tasks assessed/performed Overall Cognitive Status: Within Functional Limits for tasks assessed                                 General Comments: Answers questions accurately, states she feels "loopy."      Exercises General Exercises - Lower Extremity Ankle Circles/Pumps: AROM;Left;10 reps;Supine Quad Sets: AROM;Strengthening;Left;10 reps;Supine Gluteal Sets: AROM;Strengthening;10 reps;Supine Short Arc Quad: AROM;Strengthening;Left;10 reps;Supine Long Arc Quad: AROM;Strengthening;Left;10 reps;Supine Heel Slides: AROM;Strengthening;Left;10 reps;Supine Hip ABduction/ADduction: AROM;Strengthening;Left;10 reps;Supine (hip abduction slides, and hip adduction squeezes) Straight Leg Raises: AROM;Strengthening;Left;10 reps;Supine Hip Flexion/Marching: AROM;Strengthening;Left;10 reps;Seated     General Comments General  comments (skin integrity, edema, etc.): seated BP 121/106 mmHg in LUE (MAP 112), HR 81 bpm (some UE movement noted during BP reading), Standing BP 105/70 mmHg (MAP 83), asymptomatic       Pertinent Vitals/Pain Pain Assessment: No/denies pain (initially c/o soreness, then denies pain)    Home Living Family/patient expects to be discharged to:: Private residence Living Arrangements: Spouse/significant other Available Help at Discharge: Family;Available PRN/intermittently (pt's husband works but he & friend can provide assistance/supervision 24/7 upon d/c if necessary) Type of Home: House Home Access: Stairs to enter Entrance Stairs-Rails: None Home Layout: One level Home Equipment: Shower seat;Bedside commode;Walker - 2 wheels      Prior Function Level of Independence: Independent      Comments: no AD, cares for 5yo grandson 2 1/2 days/week, retired   PT Goals (current goals can now be found in the care plan section) Acute Rehab PT Goals Patient Stated Goal: To return to PLOF PT Goal Formulation: With patient/family Time For Goal Achievement: 08/21/20 Potential to Achieve Goals: Good    Frequency    7X/week      PT Plan      Co-evaluation              AM-PAC PT "6 Clicks" Mobility   Outcome Measure  Help needed turning from your back to your side while in a flat bed without using bedrails?: None Help needed moving from lying on your back to sitting on the side of a flat bed without using bedrails?: None Help needed moving to and from a bed to a chair (including a wheelchair)?: A Little Help needed standing up from a chair using your arms (e.g., wheelchair or bedside chair)?: A Little Help needed to walk in hospital room?: A Little Help needed climbing 3-5 steps with a railing? : A Little 6 Click Score: 20    End of Session Equipment Utilized During Treatment: Gait belt Activity Tolerance: Patient tolerated treatment well Patient left: in bed;with call bell/phone within reach;with family/visitor present Nurse Communication: Mobility status PT Visit Diagnosis: Unsteadiness on feet (R26.81);Difficulty in walking, not elsewhere classified  (R26.2);Muscle weakness (generalized) (M62.81)     Time: 8270-7867 PT Time Calculation (min) (ACUTE ONLY): 38 min  Charges:  $Therapeutic Exercise: 8-22 mins $Therapeutic Activity: 8-22 mins                     Aleda Grana, PT, DPT 08/07/20, 4:30 PM    Sandi Mariscal 08/07/2020, 4:29 PM

## 2020-08-07 NOTE — Anesthesia Procedure Notes (Addendum)
Procedure Name: LMA Insertion Date/Time: 08/07/2020 12:17 PM Performed by: Cheral Bay, CRNA Pre-anesthesia Checklist: Patient identified, Emergency Drugs available, Suction available and Patient being monitored Patient Re-evaluated:Patient Re-evaluated prior to induction Oxygen Delivery Method: Circle system utilized Preoxygenation: Pre-oxygenation with 100% oxygen Induction Type: IV induction LMA: LMA inserted LMA Size: 3.5 Tube type: Oral Number of attempts: 1 Placement Confirmation: positive ETCO2 and breath sounds checked- equal and bilateral Tube secured with: Tape Dental Injury: Teeth and Oropharynx as per pre-operative assessment

## 2020-08-07 NOTE — Transfer of Care (Signed)
Immediate Anesthesia Transfer of Care Note  Patient: Dana Arnold  Procedure(s) Performed: Left hip percutaneous pinning (Left: Hip)  Patient Location: PACU  Anesthesia Type:General  Level of Consciousness: drowsy  Airway & Oxygen Therapy: Patient Spontanous Breathing  Post-op Assessment: Post -op Vital signs reviewed and stable  Post vital signs: stable  Last Vitals:  Vitals Value Taken Time  BP 105/55 08/07/20 1400  Temp    Pulse 65 08/07/20 1401  Resp 13 08/07/20 1402  SpO2 96 % 08/07/20 1401  Vitals shown include unvalidated device data.  Last Pain:  Vitals:   08/07/20 1042  TempSrc: Temporal  PainSc: 0-No pain         Complications: No notable events documented.

## 2020-08-07 NOTE — Op Note (Signed)
DATE OF SURGERY: 08/07/2020  PREOPERATIVE DIAGNOSIS: Left valgus impacted femoral neck fracture  POSTOPERATIVE DIAGNOSIS: Left valgus impacted femoral neck fracture  PROCEDURE: Percutaneous pinning of Left femoral neck fracture  SURGEON: Rosealee Albee, MD  ASSISTANTS: none  EBL: 5 cc  COMPONENTS:  Stryker 6.7mm cannulated screws x 3 with washers (28mm, 78mm, 50mm)   INDICATIONS: Dana Arnold is a 70 y.o. female who sustained a valgus impacted femoral neck fracture after a fall. Risks and benefits of percutaneous pinning were explained to the patient and family. Risks include but are not limited to bleeding, infection, injury to tissues, nerves, vessels, DVT/PE, malunion/nonunion, hardware failure, and risks of anesthesia. The patient and family understand these risks, have completed an informed consent, and wish to proceed.   PROCEDURE:  The patient was brought into the operating room. After administering general anesthesia, the patient was placed in the supine position on the Hana table. The uninjured leg was extended while the injured lower extremity was placed in a neutral position with care taken to not displace the fracture during positioning. The lateral aspects of the operative hip and thigh were prepped with ChloraPrep solution before being draped sterilely. IV antibiotics were administered. A timeout was performed to verify the appropriate surgical site, patient, and procedure.   The greater trochanter was identified and an approximately 5 cm incision was made over the lateral aspect of the proximal femur. The incision was carried down through the subcutaneous tissues to expose the IT band. This was split at the proximal portion of the incision and the vastus lateralis was split in line with its fibers. The lateral aspect of the femur was cleared of soft tissue. Under fluoroscopic guidance, a guidewire was placed along the inferior aspect of the femoral neck into the head while  ensuring the start point on the lateral cortex was not below the level of the lesser trochanter. A parallel guide was used to place two additional guidepins (superior posterior and superior anterior). Position of all pins was verified fluoroscopically in both the AP and lateral views. A measuring device was used the measure appropriate screw length. The guidepins were drilled with a 3.65mm drill. Appropriately sized screws were advanced starting with the inferior screw, then superior posterior, then superior anterior. Screws were sequentially tightened. Hardware position and bony alignment was confirmed fluoroscopically with AP and lateral views.   The wounds were irrigated thoroughly with sterile saline solution. The IT band was closed with 0-Vicryl. Deep fat sutures were also placed with 0-Vicryl. The subcutaneous tissues were closed using 2-0 Vicryl interrupted sutures. The skin was closed using staples. Sterile occlusive dressing was applied. The patient was then transferred to the recovery room in satisfactory condition after tolerating the procedure well.  POSTOPERATIVE PLAN: The patient will be 50%WB on the operative extremity. ASA 325mg /day x 4 weeks to start on POD#1.

## 2020-08-10 ENCOUNTER — Encounter: Payer: Self-pay | Admitting: Orthopedic Surgery

## 2020-08-11 DIAGNOSIS — M25552 Pain in left hip: Secondary | ICD-10-CM | POA: Diagnosis not present

## 2020-08-14 DIAGNOSIS — M25552 Pain in left hip: Secondary | ICD-10-CM | POA: Diagnosis not present

## 2020-08-17 DIAGNOSIS — M25552 Pain in left hip: Secondary | ICD-10-CM | POA: Diagnosis not present

## 2020-08-19 DIAGNOSIS — S72045A Nondisplaced fracture of base of neck of left femur, initial encounter for closed fracture: Secondary | ICD-10-CM | POA: Diagnosis not present

## 2020-08-21 DIAGNOSIS — M25552 Pain in left hip: Secondary | ICD-10-CM | POA: Diagnosis not present

## 2020-08-24 DIAGNOSIS — M25552 Pain in left hip: Secondary | ICD-10-CM | POA: Diagnosis not present

## 2020-08-28 DIAGNOSIS — M25552 Pain in left hip: Secondary | ICD-10-CM | POA: Diagnosis not present

## 2020-09-01 DIAGNOSIS — M25552 Pain in left hip: Secondary | ICD-10-CM | POA: Diagnosis not present

## 2020-09-03 DIAGNOSIS — M25552 Pain in left hip: Secondary | ICD-10-CM | POA: Diagnosis not present

## 2020-09-14 DIAGNOSIS — M25552 Pain in left hip: Secondary | ICD-10-CM | POA: Diagnosis not present

## 2020-09-15 DIAGNOSIS — S72045A Nondisplaced fracture of base of neck of left femur, initial encounter for closed fracture: Secondary | ICD-10-CM | POA: Diagnosis not present

## 2020-09-16 DIAGNOSIS — R109 Unspecified abdominal pain: Secondary | ICD-10-CM | POA: Diagnosis not present

## 2020-09-16 DIAGNOSIS — R1310 Dysphagia, unspecified: Secondary | ICD-10-CM | POA: Diagnosis not present

## 2020-09-18 DIAGNOSIS — M25652 Stiffness of left hip, not elsewhere classified: Secondary | ICD-10-CM | POA: Diagnosis not present

## 2020-09-18 DIAGNOSIS — M25552 Pain in left hip: Secondary | ICD-10-CM | POA: Diagnosis not present

## 2020-09-18 DIAGNOSIS — R269 Unspecified abnormalities of gait and mobility: Secondary | ICD-10-CM | POA: Diagnosis not present

## 2020-09-18 DIAGNOSIS — R29898 Other symptoms and signs involving the musculoskeletal system: Secondary | ICD-10-CM | POA: Diagnosis not present

## 2020-09-21 DIAGNOSIS — R29898 Other symptoms and signs involving the musculoskeletal system: Secondary | ICD-10-CM | POA: Diagnosis not present

## 2020-09-21 DIAGNOSIS — R269 Unspecified abnormalities of gait and mobility: Secondary | ICD-10-CM | POA: Diagnosis not present

## 2020-09-21 DIAGNOSIS — M25652 Stiffness of left hip, not elsewhere classified: Secondary | ICD-10-CM | POA: Diagnosis not present

## 2020-09-21 DIAGNOSIS — M25552 Pain in left hip: Secondary | ICD-10-CM | POA: Diagnosis not present

## 2020-09-25 ENCOUNTER — Telehealth: Payer: Self-pay | Admitting: Family Medicine

## 2020-09-25 DIAGNOSIS — M25552 Pain in left hip: Secondary | ICD-10-CM | POA: Diagnosis not present

## 2020-09-25 DIAGNOSIS — R269 Unspecified abnormalities of gait and mobility: Secondary | ICD-10-CM | POA: Diagnosis not present

## 2020-09-25 DIAGNOSIS — R29898 Other symptoms and signs involving the musculoskeletal system: Secondary | ICD-10-CM | POA: Diagnosis not present

## 2020-09-25 DIAGNOSIS — M25652 Stiffness of left hip, not elsewhere classified: Secondary | ICD-10-CM | POA: Diagnosis not present

## 2020-09-25 NOTE — Telephone Encounter (Signed)
Copied from CRM [id number]. Topic: Medicare AWV >> Sep 25, 2020  2:24 PM Claudette Laws R wrote: Reason for CRM:   No answer unable to leave a message for patient to call back and schedule Medicare Annual Wellness Visit (AWV) in office.   If unable to come into the office for AWV,  please offer to do virtually or by telephone.  No hx of AWV eligible for AWVI as of 08/28/2020  Please schedule at anytime with [facility] Health Advisor.      40 Minutes appointment   Any questions, please call me at [phone]

## 2020-09-29 DIAGNOSIS — M25552 Pain in left hip: Secondary | ICD-10-CM | POA: Diagnosis not present

## 2020-09-29 DIAGNOSIS — Z4789 Encounter for other orthopedic aftercare: Secondary | ICD-10-CM | POA: Diagnosis not present

## 2020-10-02 DIAGNOSIS — Z4789 Encounter for other orthopedic aftercare: Secondary | ICD-10-CM | POA: Diagnosis not present

## 2020-10-02 DIAGNOSIS — M25552 Pain in left hip: Secondary | ICD-10-CM | POA: Diagnosis not present

## 2020-10-07 DIAGNOSIS — Z4789 Encounter for other orthopedic aftercare: Secondary | ICD-10-CM | POA: Diagnosis not present

## 2020-10-07 DIAGNOSIS — M25552 Pain in left hip: Secondary | ICD-10-CM | POA: Diagnosis not present

## 2020-10-08 ENCOUNTER — Telehealth: Payer: Self-pay | Admitting: Family Medicine

## 2020-10-08 NOTE — Telephone Encounter (Signed)
Copied from CRM [id number]. Topic: Medicare AWV >> Oct 08, 2020 10:33 AM Claudette Laws R wrote: Reason for CRM:   No answer unable to leave a message for patient to call back and schedule Medicare Annual Wellness Visit (AWV) in office.   If unable to come into the office for AWV,  please offer to do virtually or by telephone.  No hx of AWV eligible for AWVI as of 08/28/2020 per [employer]  Please schedule at anytime with [facility] Health Advisor.      40 Minutes appointment   Any questions, please call me at [phone]

## 2020-10-13 ENCOUNTER — Telehealth: Payer: Self-pay | Admitting: Family Medicine

## 2020-10-13 DIAGNOSIS — Z4789 Encounter for other orthopedic aftercare: Secondary | ICD-10-CM | POA: Diagnosis not present

## 2020-10-13 DIAGNOSIS — M25552 Pain in left hip: Secondary | ICD-10-CM | POA: Diagnosis not present

## 2020-10-13 NOTE — Telephone Encounter (Signed)
Copied from CRM [id number]. Topic: Medicare AWV >> Oct 13, 2020  3:14 PM Claudette Laws R wrote: Reason for CRM:  No answer unable to leave a message for patient to call back and schedule Medicare Annual Wellness Visit (AWV) in office.   If unable to come into the office for AWV,  please offer to do virtually or by telephone.  No hx of AWV eligible for AWVI as of 08/28/2020  Please schedule at anytime with [facility] Health Advisor.      40 Minutes appointment   Any questions, please call me at [phone]

## 2020-10-13 NOTE — Progress Notes (Signed)
Name: Dana Arnold   MRN: [id number]    DOB: 1950-05-08   Date:10/14/2020       Progress Note  Subjective  Chief Complaint  Follow Up  HPI   Subclinical hypothyroidism: last TSH was back to normal, she denies  dry skin, no change is bowel movements - she always has problems with bowel movements , no difficulty swallowing. Unchanged, we will recheck it yearly    Low Vitamin D level last level was normal, taking supplementation , discussed high calcium diet   Hypoglycemia: she has changed her diet , avoiding sweets, states no recent hypoglycemic episodes last A1C was 5.7 % . Recheck labs next visit   Osteoporosis: right femur was -2.5 %, she is not taking calcium she is on vitamin D , discussed options of therapy, oral, injectables , referral to endo . She is on regular physical activity and vitamin D supplementation. She had recent hip fracture, advised to re-consider therapy   Constipation and Dysphagia: seeing GI at [facility] and was advised to take Miralax and is scheduled for esophageal manometry . She has a history of esophageal dilation. She states she has spasms when eating and has to stop eating. She has started Miralax and has noticed improvement of pain   History of left hip fracture: it was from a fall while pushing grandson on a bike. Had surgery still having PT and is recovering well.    Patient Active Problem List   Diagnosis Date Noted   Clostridioides difficile infection 02/26/2020   Osteoarthritis 10/22/2019   Chronic neck pain 04/05/2019   Arthritis 02/19/2019   Osteoporosis, post-menopausal 01/03/2019   Scoliosis of lumbar spine 04/22/2016   Thoracic scoliosis 09/08/2015   Chondromalacia 03/10/2015   Arthritis, degenerative 12/24/2014   Hyperglycemia 12/24/2014   Cervical radiculitis 12/24/2014   Subclinical hypothyroidism 12/24/2014   Vitamin D deficiency 12/24/2014   Hx of Clostridium difficile infection 12/24/2014   External hemorrhoid 12/24/2014   Chronic  gastric erosion 12/24/2014   Chondrocalcinosis of right knee 12/24/2014   Osteopenia 12/24/2014   DDD (degenerative disc disease), lumbar 12/24/2014    Past Surgical History:  Procedure Laterality Date   arthroscopic knee Right 05/26/2014   CHOLECYSTECTOMY  02/28/1989   DILATION AND CURETTAGE OF UTERUS     HIP PINNING,CANNULATED Left 08/07/2020   Procedure: Left hip percutaneous pinning;  Surgeon: Leim Fabry, MD;  Location: [facility] ORS;  Service: Orthopedics;  Laterality: Left;   right ulna surgery Right 07/01/2010   TONSILLECTOMY AND ADENOIDECTOMY Bilateral 03/01/1959    Family History  Problem Relation Age of Onset   Osteoporosis Mother    Aneurysm Father     Social History   Tobacco Use   Smoking status: Never   Smokeless tobacco: Never  Substance Use Topics   Alcohol use: No    Alcohol/week: 0.0 standard drinks     Current Outpatient Medications:    blood glucose meter kit and supplies, Dispense based on patient and insurance preference. Use up to four times daily as directed. Hypoglycemia episodes, Disp: 1 each, Rfl: 0   Cholecalciferol (VITAMIN D) 2000 units tablet, Take 3,000 Units by mouth daily., Disp: , Rfl:    cyanocobalamin 1000 MCG tablet, Take 1,000 mcg by mouth 3 (three) times a week., Disp: , Rfl:    Magnesium 200 MG TABS, Take 400 mg by mouth daily., Disp: , Rfl:    Misc Natural Products (TURMERIC CURCUMIN) CAPS, Take 2 capsules by mouth daily., Disp: , Rfl:  acetaminophen (TYLENOL) 500 MG tablet, Take 2 tablets (1,000 mg total) by mouth every 8 (eight) hours. (Patient not taking: Reported on 10/14/2020), Disp: 90 tablet, Rfl: 2   docusate sodium (COLACE) 100 MG capsule, Take 200 mg by mouth daily as needed. (Patient not taking: Reported on 10/14/2020), Disp: , Rfl:    fluticasone (FLONASE) 50 MCG/ACT nasal spray, Place 1-2 sprays into both nostrils daily as needed for allergies or rhinitis. (Patient not taking: Reported on 10/14/2020), Disp: , Rfl:     HYDROcodone-acetaminophen (NORCO) 5-325 MG tablet, Take 1-2 tablets by mouth every 4 (four) hours as needed for moderate pain or severe pain. (Patient not taking: Reported on 10/14/2020), Disp: 10 tablet, Rfl: 0   ondansetron (ZOFRAN ODT) 4 MG disintegrating tablet, Take 1 tablet (4 mg total) by mouth every 8 (eight) hours as needed for nausea or vomiting. (Patient not taking: Reported on 10/14/2020), Disp: 20 tablet, Rfl: 0  Allergies  Allergen Reactions   Terazol 3 [Terconazole] Swelling and Rash   Macrobid [Nitrofurantoin Macrocrystal]     I personally reviewed active problem list, medication list, allergies, family history, social history, health maintenance with the patient/caregiver today.   ROS  Constitutional: Negative for fever or weight change.  Respiratory: Negative for cough and shortness of breath.   Cardiovascular: Negative for chest pain or palpitations.  Gastrointestinal: Negative for abdominal pain, no bowel changes.  Musculoskeletal: Positive for gait problem but no joint swelling.  Skin: Negative for rash.  Neurological: Negative for dizziness or headache.  No other specific complaints in a complete review of systems (except as listed in HPI above).   Objective  Vitals:   10/14/20 0857  BP: 118/62  Pulse: 92  Resp: 16  Temp: 97.9 F (36.6 C)  SpO2: 97%  Weight: 127 lb (57.6 kg)  Height: $Remove'5\' 5"'eddtsjb$  (1.651 m)    Body mass index is 21.13 kg/m.  Physical Exam  Constitutional: Patient appears well-developed and well-nourished.  No distress.  HEENT: head atraumatic, normocephalic, pupils equal and reactive to light, neck supple Cardiovascular: Normal rate, regular rhythm and normal heart sounds.  No murmur heard. No BLE edema. Pulmonary/Chest: Effort normal and breath sounds normal. No respiratory distress. Abdominal: Soft.  There is no tenderness. Psychiatric: Patient has a normal mood and affect. behavior is normal. Judgment and thought content normal.   Muscular skeletal: good rom of both hips   Recent Results (from the past 2160 hour(s))  Hemoglobin and hematocrit, blood     Status: None   Collection Time: 08/07/20 10:47 AM  Result Value Ref Range   Hemoglobin 12.6 12.0 - 15.0 g/dL   HCT 38.7 36.0 - 46.0 %    Comment: Performed at [facility], [address] [place], [place] [address]  Type and screen [place]     Status: None   Collection Time: 08/07/20 10:47 AM  Result Value Ref Range   ABO/RH(D) O POS    Antibody Screen NEG    Sample Expiration      08/10/2020,2359 Performed at [facility], [address], [place], [place] [address]   ABO/Rh     Status: None   Collection Time: 08/07/20 10:53 AM  Result Value Ref Range   ABO/RH(D)      O POS Performed at [facility], [address] [place], [place] [address]   SARS Coronavirus 2 by RT PCR (hospital order, performed in [facility] hospital lab) Nasopharyngeal Nasopharyngeal Swab     Status:  None   Collection Time: 08/07/20 11:00 AM   Specimen: Nasopharyngeal Swab  Result Value Ref Range   SARS Coronavirus 2 NEGATIVE NEGATIVE    Comment: (NOTE) SARS-CoV-2 target nucleic acids are NOT DETECTED.  The SARS-CoV-2 RNA is generally detectable in upper and lower respiratory specimens during the acute phase of infection. The lowest concentration of SARS-CoV-2 viral copies this assay can detect is 250 copies / mL. A negative result does not preclude SARS-CoV-2 infection and should not be used as the sole basis for treatment or other patient management decisions.  A negative result may occur with improper specimen collection / handling, submission of specimen other than nasopharyngeal swab, presence of viral mutation(s) within the areas targeted by this assay, and inadequate number of viral copies (<250 copies / mL). A negative result must be combined with clinical observations, patient history, and epidemiological  information.  Fact Sheet for Patients:   StrictlyIdeas.no  Fact Sheet for Healthcare Providers: BankingDealers.co.za  This test is not yet approved or  cleared by the [place] FDA and has been authorized for detection and/or diagnosis of SARS-CoV-2 by FDA under an Emergency Use Authorization (EUA).  This EUA will remain in effect (meaning this test can be used) for the duration of the COVID-19 declaration under Section 564(b)(1) of the Act, 21 U.S.C. section 360bbb-3(b)(1), unless the authorization is terminated or revoked sooner.  Performed at [facility], [place], [place] [address]       PHQ2/9: Depression screen [facility] 2/9 10/14/2020 04/21/2020 10/23/2019 12/11/2018 11/27/2018  Decreased Interest 0 0 0 0 0  Down, Depressed, Hopeless 0 0 0 0 0  PHQ - 2 Score 0 0 0 0 0  Altered sleeping - - 0 0 0  Tired, decreased energy - - 0 0 0  Change in appetite - - 0 0 0  Feeling bad or failure about yourself  - - 0 0 0  Trouble concentrating - - 0 0 0  Moving slowly or fidgety/restless - - 0 0 0  Suicidal thoughts - - 0 0 0  PHQ-9 Score - - 0 0 0  Difficult doing work/chores - - - - -    phq 9 is negative   Fall Risk: Fall Risk  10/14/2020 04/21/2020 10/23/2019 12/11/2018 11/27/2018  Falls in the past year? 1 0 0 0 0  Number falls in past yr: 0 0 0 0 0  Injury with Fall? 1 0 0 0 0  Risk for fall due to : - - - - -  Risk for fall due to: Comment - - - - -     Functional Status Survey: Is the patient deaf or have difficulty hearing?: No Does the patient have difficulty seeing, even when wearing glasses/contacts?: No Does the patient have difficulty concentrating, remembering, or making decisions?: No Does the patient have difficulty walking or climbing stairs?: No Does the patient have difficulty dressing or bathing?: No Does the patient have difficulty doing errands alone such as visiting a doctor's  office or shopping?: No    Assessment & Plan  1. Constipation, unspecified constipation type   2. Dysphagia, unspecified type   3. Hyperglycemia   4. Subclinical hypothyroidism  - TSH  5. Primary osteoarthritis of right knee   6. Age-related osteoporosis without current pathological fracture  - COMPLETE METABOLIC PANEL WITH GFR - CBC with Differential/Platelet - Parathyroid hormone, intact (no Ca) - Ambulatory referral to Endocrinology  7. Pure hypercholesterolemia  - Lipid panel  8. Vitamin D deficiency  - VITAMIN D 25 Hydroxy (Vit-D Deficiency, Fractures)  9. History of fracture of left hip  - Ambulatory referral to Endocrinology

## 2020-10-14 ENCOUNTER — Ambulatory Visit: Payer: Medicare PPO | Admitting: Family Medicine

## 2020-10-14 ENCOUNTER — Encounter: Payer: Self-pay | Admitting: Family Medicine

## 2020-10-14 ENCOUNTER — Other Ambulatory Visit: Payer: Self-pay

## 2020-10-14 VITALS — BP 118/62 | HR 92 | Temp 97.9°F | Resp 16 | Ht 65.0 in | Wt 127.0 lb

## 2020-10-14 DIAGNOSIS — E7800 Pure hypercholesterolemia, unspecified: Secondary | ICD-10-CM

## 2020-10-14 DIAGNOSIS — R739 Hyperglycemia, unspecified: Secondary | ICD-10-CM

## 2020-10-14 DIAGNOSIS — M1711 Unilateral primary osteoarthritis, right knee: Secondary | ICD-10-CM | POA: Diagnosis not present

## 2020-10-14 DIAGNOSIS — E038 Other specified hypothyroidism: Secondary | ICD-10-CM

## 2020-10-14 DIAGNOSIS — M810 Age-related osteoporosis without current pathological fracture: Secondary | ICD-10-CM

## 2020-10-14 DIAGNOSIS — Z8781 Personal history of (healed) traumatic fracture: Secondary | ICD-10-CM | POA: Insufficient documentation

## 2020-10-14 DIAGNOSIS — K5900 Constipation, unspecified: Secondary | ICD-10-CM

## 2020-10-14 DIAGNOSIS — E559 Vitamin D deficiency, unspecified: Secondary | ICD-10-CM

## 2020-10-14 DIAGNOSIS — R1310 Dysphagia, unspecified: Secondary | ICD-10-CM

## 2020-10-14 NOTE — Patient Instructions (Signed)
Forteo Prolia

## 2020-10-15 ENCOUNTER — Encounter: Payer: Self-pay | Admitting: Family Medicine

## 2020-10-15 DIAGNOSIS — Z4789 Encounter for other orthopedic aftercare: Secondary | ICD-10-CM | POA: Diagnosis not present

## 2020-10-15 DIAGNOSIS — M25552 Pain in left hip: Secondary | ICD-10-CM | POA: Diagnosis not present

## 2020-10-15 LAB — COMPLETE METABOLIC PANEL WITH GFR
AG Ratio: 1.5 (calc) (ref 1.0–2.5)
ALT: 17 U/L (ref 6–29)
AST: 21 U/L (ref 10–35)
Albumin: 4 g/dL (ref 3.6–5.1)
Alkaline phosphatase (APISO): 88 U/L (ref 37–153)
BUN: 16 mg/dL (ref 7–25)
CO2: 30 mmol/L (ref 20–32)
Calcium: 9.5 mg/dL (ref 8.6–10.4)
Chloride: 107 mmol/L (ref 98–110)
Creat: 0.62 mg/dL (ref 0.50–1.05)
Globulin: 2.6 g/dL (calc) (ref 1.9–3.7)
Glucose, Bld: 85 mg/dL (ref 65–99)
Potassium: 4.6 mmol/L (ref 3.5–5.3)
Sodium: 143 mmol/L (ref 135–146)
Total Bilirubin: 0.4 mg/dL (ref 0.2–1.2)
Total Protein: 6.6 g/dL (ref 6.1–8.1)
eGFR: 98 mL/min/{1.73_m2} (ref >=60)

## 2020-10-15 LAB — CBC WITH DIFFERENTIAL/PLATELET
Absolute Monocytes: 625 cells/uL (ref 200–950)
Basophils Absolute: 32 cells/uL (ref 0–200)
Basophils Relative: 0.6 %
Eosinophils Absolute: 80 cells/uL (ref 15–500)
Eosinophils Relative: 1.5 %
HCT: 42 % (ref 35.0–45.0)
Hemoglobin: 13.3 g/dL (ref 11.7–15.5)
Lymphs Abs: 1866 cells/uL (ref 850–3900)
MCH: 30.2 pg (ref 27.0–33.0)
MCHC: 31.7 g/dL — ABNORMAL LOW (ref 32.0–36.0)
MCV: 95.5 fL (ref 80.0–100.0)
MPV: 11.8 fL (ref 7.5–12.5)
Monocytes Relative: 11.8 %
Neutro Abs: 2698 cells/uL (ref 1500–7800)
Neutrophils Relative %: 50.9 %
Platelets: 204 10*3/uL (ref 140–400)
RBC: 4.4 10*6/uL (ref 3.80–5.10)
RDW: 12.3 % (ref 11.0–15.0)
Total Lymphocyte: 35.2 %
WBC: 5.3 10*3/uL (ref 3.8–10.8)

## 2020-10-15 LAB — LIPID PANEL
Cholesterol: 208 mg/dL — ABNORMAL HIGH (ref <200)
HDL: 71 mg/dL (ref >=50)
LDL Cholesterol (Calc): 121 mg/dL (calc) — ABNORMAL HIGH
Non-HDL Cholesterol (Calc): 137 mg/dL (calc) — ABNORMAL HIGH (ref <130)
Total CHOL/HDL Ratio: 2.9 (calc) (ref <5.0)
Triglycerides: 68 mg/dL (ref <150)

## 2020-10-15 LAB — TSH: TSH: 6.5 mIU/L — ABNORMAL HIGH (ref 0.40–4.50)

## 2020-10-15 LAB — PARATHYROID HORMONE, INTACT (NO CA): PTH: 33 pg/mL (ref 16–77)

## 2020-10-15 LAB — VITAMIN D 25 HYDROXY (VIT D DEFICIENCY, FRACTURES): Vit D, 25-Hydroxy: 65 ng/mL (ref 30–100)

## 2020-10-20 DIAGNOSIS — M25552 Pain in left hip: Secondary | ICD-10-CM | POA: Diagnosis not present

## 2020-10-20 DIAGNOSIS — Z4789 Encounter for other orthopedic aftercare: Secondary | ICD-10-CM | POA: Diagnosis not present

## 2020-10-21 DIAGNOSIS — R1310 Dysphagia, unspecified: Secondary | ICD-10-CM | POA: Diagnosis not present

## 2020-10-21 DIAGNOSIS — E785 Hyperlipidemia, unspecified: Secondary | ICD-10-CM | POA: Diagnosis not present

## 2020-10-21 DIAGNOSIS — Z79899 Other long term (current) drug therapy: Secondary | ICD-10-CM | POA: Diagnosis not present

## 2020-10-21 DIAGNOSIS — R1314 Dysphagia, pharyngoesophageal phase: Secondary | ICD-10-CM | POA: Diagnosis not present

## 2020-10-21 DIAGNOSIS — K222 Esophageal obstruction: Secondary | ICD-10-CM | POA: Diagnosis not present

## 2020-10-21 DIAGNOSIS — K219 Gastro-esophageal reflux disease without esophagitis: Secondary | ICD-10-CM | POA: Diagnosis not present

## 2020-10-22 DIAGNOSIS — M25552 Pain in left hip: Secondary | ICD-10-CM | POA: Diagnosis not present

## 2020-10-22 DIAGNOSIS — Z4789 Encounter for other orthopedic aftercare: Secondary | ICD-10-CM | POA: Diagnosis not present

## 2020-10-26 DIAGNOSIS — D2262 Melanocytic nevi of left upper limb, including shoulder: Secondary | ICD-10-CM | POA: Diagnosis not present

## 2020-10-26 DIAGNOSIS — D2272 Melanocytic nevi of left lower limb, including hip: Secondary | ICD-10-CM | POA: Diagnosis not present

## 2020-10-26 DIAGNOSIS — D2271 Melanocytic nevi of right lower limb, including hip: Secondary | ICD-10-CM | POA: Diagnosis not present

## 2020-10-26 DIAGNOSIS — L821 Other seborrheic keratosis: Secondary | ICD-10-CM | POA: Diagnosis not present

## 2020-10-26 DIAGNOSIS — D225 Melanocytic nevi of trunk: Secondary | ICD-10-CM | POA: Diagnosis not present

## 2020-10-26 DIAGNOSIS — D485 Neoplasm of uncertain behavior of skin: Secondary | ICD-10-CM | POA: Diagnosis not present

## 2020-10-26 DIAGNOSIS — D2261 Melanocytic nevi of right upper limb, including shoulder: Secondary | ICD-10-CM | POA: Diagnosis not present

## 2020-10-26 DIAGNOSIS — L814 Other melanin hyperpigmentation: Secondary | ICD-10-CM | POA: Diagnosis not present

## 2020-10-28 ENCOUNTER — Telehealth: Payer: Self-pay | Admitting: Family Medicine

## 2020-10-28 NOTE — Telephone Encounter (Signed)
Copied from CRM [id number]. Topic: Medicare AWV >> Oct 28, 2020 11:22 AM Claudette Laws R wrote: Reason for CRM:   Left message for patient to call back and schedule Medicare Annual Wellness Visit (AWV) in office.   If unable to come into the office for AWV,  please offer to do virtually or by telephone.  No hx of AWV eligible for AWVI as of 08/28/2020   Please schedule at anytime with [facility] Health Advisor.      40 Minutes appointment   Any questions, please call me at [phone]

## 2020-10-29 DIAGNOSIS — Z4789 Encounter for other orthopedic aftercare: Secondary | ICD-10-CM | POA: Diagnosis not present

## 2020-10-29 DIAGNOSIS — M25552 Pain in left hip: Secondary | ICD-10-CM | POA: Diagnosis not present

## 2020-11-03 ENCOUNTER — Ambulatory Visit: Payer: Medicare PPO

## 2020-11-04 DIAGNOSIS — M25552 Pain in left hip: Secondary | ICD-10-CM | POA: Diagnosis not present

## 2020-11-04 DIAGNOSIS — Z4789 Encounter for other orthopedic aftercare: Secondary | ICD-10-CM | POA: Diagnosis not present

## 2020-11-10 DIAGNOSIS — Z4789 Encounter for other orthopedic aftercare: Secondary | ICD-10-CM | POA: Diagnosis not present

## 2020-11-10 DIAGNOSIS — M25552 Pain in left hip: Secondary | ICD-10-CM | POA: Diagnosis not present

## 2020-11-17 DIAGNOSIS — M25552 Pain in left hip: Secondary | ICD-10-CM | POA: Diagnosis not present

## 2020-11-17 DIAGNOSIS — Z4789 Encounter for other orthopedic aftercare: Secondary | ICD-10-CM | POA: Diagnosis not present

## 2020-11-26 DIAGNOSIS — M25552 Pain in left hip: Secondary | ICD-10-CM | POA: Diagnosis not present

## 2020-11-26 DIAGNOSIS — Z4789 Encounter for other orthopedic aftercare: Secondary | ICD-10-CM | POA: Diagnosis not present

## 2020-11-26 DIAGNOSIS — S72045A Nondisplaced fracture of base of neck of left femur, initial encounter for closed fracture: Secondary | ICD-10-CM | POA: Diagnosis not present

## 2020-12-08 DIAGNOSIS — Z4789 Encounter for other orthopedic aftercare: Secondary | ICD-10-CM | POA: Diagnosis not present

## 2020-12-08 DIAGNOSIS — M25552 Pain in left hip: Secondary | ICD-10-CM | POA: Diagnosis not present

## 2020-12-22 DIAGNOSIS — Z4789 Encounter for other orthopedic aftercare: Secondary | ICD-10-CM | POA: Diagnosis not present

## 2020-12-22 DIAGNOSIS — M25552 Pain in left hip: Secondary | ICD-10-CM | POA: Diagnosis not present

## 2020-12-23 DIAGNOSIS — K5900 Constipation, unspecified: Secondary | ICD-10-CM | POA: Diagnosis not present

## 2020-12-30 DIAGNOSIS — K5900 Constipation, unspecified: Secondary | ICD-10-CM | POA: Diagnosis not present

## 2021-02-01 DIAGNOSIS — M810 Age-related osteoporosis without current pathological fracture: Secondary | ICD-10-CM | POA: Diagnosis not present

## 2021-02-01 DIAGNOSIS — Z8781 Personal history of (healed) traumatic fracture: Secondary | ICD-10-CM | POA: Diagnosis not present

## 2021-02-18 DIAGNOSIS — H0011 Chalazion right upper eyelid: Secondary | ICD-10-CM | POA: Diagnosis not present

## 2021-03-03 DIAGNOSIS — Z1231 Encounter for screening mammogram for malignant neoplasm of breast: Secondary | ICD-10-CM | POA: Diagnosis not present

## 2021-03-03 LAB — HM MAMMOGRAPHY

## 2021-03-10 DIAGNOSIS — H0015 Chalazion left lower eyelid: Secondary | ICD-10-CM | POA: Diagnosis not present

## 2021-03-10 DIAGNOSIS — K5902 Outlet dysfunction constipation: Secondary | ICD-10-CM | POA: Diagnosis not present

## 2021-03-31 DIAGNOSIS — N952 Postmenopausal atrophic vaginitis: Secondary | ICD-10-CM | POA: Diagnosis not present

## 2021-03-31 DIAGNOSIS — Z124 Encounter for screening for malignant neoplasm of cervix: Secondary | ICD-10-CM | POA: Diagnosis not present

## 2021-03-31 DIAGNOSIS — M810 Age-related osteoporosis without current pathological fracture: Secondary | ICD-10-CM | POA: Diagnosis not present

## 2021-03-31 DIAGNOSIS — Z6822 Body mass index (BMI) 22.0-22.9, adult: Secondary | ICD-10-CM | POA: Diagnosis not present

## 2021-04-01 LAB — HM PAP SMEAR: HM Pap smear: NEGATIVE

## 2021-04-13 NOTE — Progress Notes (Signed)
Name: Dana Arnold   MRN: [id number]    DOB: [birth year]   Date:04/14/2021       Progress Note  Subjective  Chief Complaint  Follow Up  HPI  Subclinical hypothyroidism: last TSH was 6.5 . She denies  dry skin, change in bowel movements - she always has problems with bowel movements , no difficulty swallowing. We will recheck level today    Low Vitamin D level last level was normal, taking supplementation , discussed high calcium diet    B12 insufficieny: she has been taking SL supplementation three times a week.   Pre-diabetes : she has changed her diet , avoiding sweets, states no recent hypoglycemic episodes last A1C was 5.7 % we will recheck labs. Denies polyphagia, polydipsia or polyuria. . Recheck labs today   Dyslipidemia: LDL in the mid 120's  The 10-year ASCVD risk score (Arnett DK, et al., 2019) is: 7.2%   Values used to calculate the score:     Age: 71 years     Sex: Female     Is Non-Hispanic African American: No     Diabetic: No     Tobacco smoker: No     Systolic Blood Pressure: 601 mmHg     Is BP treated: No     HDL Cholesterol: 71 mg/dL     Total Cholesterol: 208 mg/dL   Osteoporosis: right femur was -2.5 %, she is not taking calcium she is on vitamin D , discussed options of therapy, oral, injectables , she was seen by Endo and is considering Prolia.  She is on regular physical activity and vitamin D supplementation. She has a history of left  hip fracture   Constipation : seeing GI at [place] and diagnosed with pelvic floor dyssynergia and is going to start PT next month, she is also taking Miralax and has helped with her bowel movement   History of left hip fracture: it was from a fall while pushing grandson on a bike. 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   Gastro-esophageal reflux disease without esophagitis 04/14/2021   Tortuous colon  04/14/2021   Osteoporosis 03/31/2021   History of fracture of left hip 10/14/2020   Osteoarthritis 10/22/2019   Chronic neck pain 04/05/2019   Osteoporosis, post-menopausal 01/03/2019   Scoliosis of lumbar spine 04/22/2016   Thoracic scoliosis 09/08/2015   Chondromalacia 03/10/2015   Hyperglycemia 12/24/2014   Cervical radiculitis 12/24/2014   Subclinical hypothyroidism 12/24/2014   Vitamin D deficiency 12/24/2014   Hx of Clostridium difficile infection 12/24/2014   External hemorrhoid 12/24/2014   Chronic gastric erosion 12/24/2014   Chondrocalcinosis of right knee 12/24/2014   DDD (degenerative disc disease), lumbar 12/24/2014    Past Surgical History:  Procedure Laterality Date   arthroscopic knee Right 05/26/2014   CHOLECYSTECTOMY  02/28/1989   DILATION AND CURETTAGE OF UTERUS     HIP PINNING,CANNULATED Left 08/07/2020   Procedure: Left hip percutaneous pinning;  Surgeon: Leim Fabry, MD;  Location: [facility] ORS;  Service: Orthopedics;  Laterality: Left;   right ulna surgery Right 07/01/2010   TONSILLECTOMY AND ADENOIDECTOMY Bilateral 03/01/1959    Family History  Problem Relation Age of Onset   Osteoporosis Mother    Aneurysm Father     Social History   Tobacco Use   Smoking status: Never   Smokeless tobacco: Never  Substance Use Topics  Alcohol use: No    Alcohol/week: 0.0 standard drinks     Current Outpatient Medications:    blood glucose meter kit and supplies, Dispense based on patient and insurance preference. Use up to four times daily as directed. Hypoglycemia episodes, Disp: 1 each, Rfl: 0   Cholecalciferol (VITAMIN D) 2000 units tablet, Take 3,000 Units by mouth daily., Disp: , Rfl:    cyanocobalamin 1000 MCG tablet, Take 1,000 mcg by mouth 3 (three) times a week., Disp: , Rfl:    Magnesium 200 MG TABS, Take 400 mg by mouth daily., Disp: , Rfl:    Misc Natural Products (TURMERIC CURCUMIN) CAPS, Take 2 capsules by mouth daily., Disp: , Rfl:     neomycin-polymyxin b-dexamethasone (MAXITROL) 3.5-10000-0.1 OINT, SMARTSIG:Sparingly In Eye(s) 3 Times Daily, Disp: , Rfl:   Allergies  Allergen Reactions   Terazol 3 [Terconazole] Swelling and Rash   Macrobid [Nitrofurantoin Macrocrystal]     I personally reviewed active problem list, medication list, allergies, family history, social history, health maintenance with the patient/caregiver today.   ROS  Constitutional: Negative for fever or weight change.  Respiratory: Negative for cough and shortness of breath.   Cardiovascular: Negative for chest pain or palpitations.  Gastrointestinal: Negative for abdominal pain, no bowel changes.  Musculoskeletal: Negative for gait problem or joint swelling.  Skin: Negative for rash.  Neurological: Negative for dizziness or headache.  No other specific complaints in a complete review of systems (except as listed in HPI above).   Objective  Vitals:   04/14/21 0859  BP: 128/80  Pulse: 84  Resp: 16  Temp: 97.8 F (36.6 C)  TempSrc: Oral  SpO2: 98%  Weight: 130 lb (59 kg)  Height: (!) 5.4" (0.137 m)    Body mass index is 3,134.43 kg/m.  Physical Exam  Constitutional: Patient appears well-developed and well-nourished. Obese  No distress.  HEENT: head atraumatic, normocephalic, pupils equal and reactive to light,  neck supple Cardiovascular: Normal rate, regular rhythm and normal heart sounds.  No murmur heard. No BLE edema. Pulmonary/Chest: Effort normal and breath sounds normal. No respiratory distress. Abdominal: Soft.  There is no tenderness. Psychiatric: Patient has a normal mood and affect. behavior is normal. Judgment and thought content normal.   PHQ2/9: Depression screen [facility] 2/9 04/14/2021 10/14/2020 04/21/2020 10/23/2019 12/11/2018  Decreased Interest 0 0 0 0 0  Down, Depressed, Hopeless 0 0 0 0 0  PHQ - 2 Score 0 0 0 0 0  Altered sleeping - - - 0 0  Tired, decreased energy - - - 0 0  Change in appetite - - - 0 0  Feeling  bad or failure about yourself  - - - 0 0  Trouble concentrating - - - 0 0  Moving slowly or fidgety/restless - - - 0 0  Suicidal thoughts - - - 0 0  PHQ-9 Score - - - 0 0  Difficult doing work/chores - - - - -    phq 9 is negative   Fall Risk: Fall Risk  04/14/2021 04/14/2021 10/14/2020 04/21/2020 10/23/2019  Falls in the past year? 1 0 1 0 0  Number falls in past yr: 0 0 0 0 0  Injury with Fall? 0 0 1 0 0  Risk for fall due to : - No Fall Risks - - -  Risk for fall due to: Comment - - - - -  Follow up Falls prevention discussed;Falls evaluation completed Falls prevention discussed - - -  Functional Status Survey: Is the patient deaf or have difficulty hearing?: No Does the patient have difficulty seeing, even when wearing glasses/contacts?: No Does the patient have difficulty concentrating, remembering, or making decisions?: No Does the patient have difficulty walking or climbing stairs?: No Does the patient have difficulty dressing or bathing?: No Does the patient have difficulty doing errands alone such as visiting a doctor's office or shopping?: No    Assessment & Plan  1. Age-related osteoporosis without current pathological fracture  - DG Bone Density; Future  2. Subclinical hypothyroidism  - TSH + free T4  3. Primary osteoarthritis of right knee   4. Vitamin D deficiency   5. Pure hypercholesterolemia  - Lipid panel  6. Pre-diabetes  - Hemoglobin A1c  7. Hyperglycemia  - Hemoglobin A1c

## 2021-04-14 ENCOUNTER — Encounter: Payer: Self-pay | Admitting: Family Medicine

## 2021-04-14 ENCOUNTER — Other Ambulatory Visit: Payer: Self-pay

## 2021-04-14 ENCOUNTER — Ambulatory Visit: Payer: Medicare PPO | Admitting: Family Medicine

## 2021-04-14 VITALS — BP 128/80 | HR 84 | Temp 97.8°F | Resp 16 | Ht 64.0 in | Wt 130.0 lb

## 2021-04-14 DIAGNOSIS — M810 Age-related osteoporosis without current pathological fracture: Secondary | ICD-10-CM | POA: Diagnosis not present

## 2021-04-14 DIAGNOSIS — E7800 Pure hypercholesterolemia, unspecified: Secondary | ICD-10-CM | POA: Diagnosis not present

## 2021-04-14 DIAGNOSIS — M1711 Unilateral primary osteoarthritis, right knee: Secondary | ICD-10-CM

## 2021-04-14 DIAGNOSIS — E038 Other specified hypothyroidism: Secondary | ICD-10-CM | POA: Diagnosis not present

## 2021-04-14 DIAGNOSIS — Q433 Congenital malformations of intestinal fixation: Secondary | ICD-10-CM | POA: Insufficient documentation

## 2021-04-14 DIAGNOSIS — E538 Deficiency of other specified B group vitamins: Secondary | ICD-10-CM | POA: Diagnosis not present

## 2021-04-14 DIAGNOSIS — Q438 Other specified congenital malformations of intestine: Secondary | ICD-10-CM | POA: Insufficient documentation

## 2021-04-14 DIAGNOSIS — R739 Hyperglycemia, unspecified: Secondary | ICD-10-CM | POA: Diagnosis not present

## 2021-04-14 DIAGNOSIS — K219 Gastro-esophageal reflux disease without esophagitis: Secondary | ICD-10-CM | POA: Insufficient documentation

## 2021-04-14 DIAGNOSIS — R7303 Prediabetes: Secondary | ICD-10-CM

## 2021-04-14 DIAGNOSIS — E559 Vitamin D deficiency, unspecified: Secondary | ICD-10-CM | POA: Diagnosis not present

## 2021-04-19 ENCOUNTER — Other Ambulatory Visit: Payer: Self-pay

## 2021-04-19 DIAGNOSIS — E559 Vitamin D deficiency, unspecified: Secondary | ICD-10-CM

## 2021-04-19 DIAGNOSIS — E538 Deficiency of other specified B group vitamins: Secondary | ICD-10-CM

## 2021-04-21 DIAGNOSIS — D3132 Benign neoplasm of left choroid: Secondary | ICD-10-CM | POA: Diagnosis not present

## 2021-04-21 DIAGNOSIS — H524 Presbyopia: Secondary | ICD-10-CM | POA: Diagnosis not present

## 2021-04-22 LAB — HEMOGLOBIN A1C
Hgb A1c MFr Bld: 5.4 % of total Hgb (ref <5.7)
Mean Plasma Glucose: 108 mg/dL
eAG (mmol/L): 6 mmol/L

## 2021-04-22 LAB — TEST AUTHORIZATION

## 2021-04-22 LAB — TSH+FREE T4: TSH W/REFLEX TO FT4: 4.51 mIU/L — ABNORMAL HIGH (ref 0.40–4.50)

## 2021-04-22 LAB — T4, FREE: Free T4: 1.1 ng/dL (ref 0.8–1.8)

## 2021-04-22 LAB — LIPID PANEL
Cholesterol: 237 mg/dL — ABNORMAL HIGH (ref <200)
HDL: 81 mg/dL (ref >=50)
LDL Cholesterol (Calc): 141 mg/dL (calc) — ABNORMAL HIGH
Non-HDL Cholesterol (Calc): 156 mg/dL (calc) — ABNORMAL HIGH (ref <130)
Total CHOL/HDL Ratio: 2.9 (calc) (ref <5.0)
Triglycerides: 56 mg/dL (ref <150)

## 2021-04-22 LAB — VITAMIN B12: Vitamin B-12: 484 pg/mL (ref 200–1100)

## 2021-04-28 ENCOUNTER — Telehealth: Payer: Self-pay | Admitting: Family Medicine

## 2021-04-28 NOTE — Telephone Encounter (Signed)
Copied from CRM [id number]. Topic: Medicare AWV ?>> Apr 28, 2021  9:38 AM Claudette Laws R wrote: ?Reason for CRM:  ? ?Left message for patient to call back and schedule Medicare Annual Wellness Visit (AWV) in office.  ? ?If unable to come into the office for AWV,  please offer to do virtually or by telephone. ? ?No hx of AWV eligible for AWVI as of 08/28/2020 ? ?Please schedule at anytime with [facility] Health Advisor.     ? ?40 Minutes appointment  ? ?Any questions, please call me at [phone] ?

## 2021-05-03 DIAGNOSIS — M6289 Other specified disorders of muscle: Secondary | ICD-10-CM | POA: Diagnosis not present

## 2021-05-03 DIAGNOSIS — K5902 Outlet dysfunction constipation: Secondary | ICD-10-CM | POA: Diagnosis not present

## 2021-05-03 DIAGNOSIS — R278 Other lack of coordination: Secondary | ICD-10-CM | POA: Diagnosis not present

## 2021-05-10 DIAGNOSIS — R278 Other lack of coordination: Secondary | ICD-10-CM | POA: Diagnosis not present

## 2021-05-10 DIAGNOSIS — K5902 Outlet dysfunction constipation: Secondary | ICD-10-CM | POA: Diagnosis not present

## 2021-05-10 DIAGNOSIS — M6289 Other specified disorders of muscle: Secondary | ICD-10-CM | POA: Diagnosis not present

## 2021-05-17 DIAGNOSIS — M6289 Other specified disorders of muscle: Secondary | ICD-10-CM | POA: Diagnosis not present

## 2021-05-17 DIAGNOSIS — K5902 Outlet dysfunction constipation: Secondary | ICD-10-CM | POA: Diagnosis not present

## 2021-05-17 DIAGNOSIS — R278 Other lack of coordination: Secondary | ICD-10-CM | POA: Diagnosis not present

## 2021-05-31 DIAGNOSIS — R278 Other lack of coordination: Secondary | ICD-10-CM | POA: Diagnosis not present

## 2021-05-31 DIAGNOSIS — K5902 Outlet dysfunction constipation: Secondary | ICD-10-CM | POA: Diagnosis not present

## 2021-05-31 DIAGNOSIS — M6289 Other specified disorders of muscle: Secondary | ICD-10-CM | POA: Diagnosis not present

## 2021-06-02 ENCOUNTER — Telehealth: Payer: Self-pay | Admitting: Family Medicine

## 2021-06-02 NOTE — Telephone Encounter (Signed)
Pt states she got a mammogram at [facility] on 03/03/2021 .  Everything was fine. ?She will bring her information with her at next visit. ?

## 2021-06-09 DIAGNOSIS — K5902 Outlet dysfunction constipation: Secondary | ICD-10-CM | POA: Insufficient documentation

## 2021-06-14 ENCOUNTER — Encounter: Payer: Self-pay | Admitting: *Deleted

## 2021-06-14 ENCOUNTER — Other Ambulatory Visit: Payer: Self-pay

## 2021-06-14 DIAGNOSIS — K529 Noninfective gastroenteritis and colitis, unspecified: Secondary | ICD-10-CM | POA: Diagnosis not present

## 2021-06-14 DIAGNOSIS — I700 Atherosclerosis of aorta: Secondary | ICD-10-CM | POA: Diagnosis not present

## 2021-06-14 DIAGNOSIS — A084 Viral intestinal infection, unspecified: Secondary | ICD-10-CM | POA: Diagnosis not present

## 2021-06-14 DIAGNOSIS — R1084 Generalized abdominal pain: Secondary | ICD-10-CM | POA: Diagnosis not present

## 2021-06-14 DIAGNOSIS — R1110 Vomiting, unspecified: Secondary | ICD-10-CM | POA: Diagnosis not present

## 2021-06-14 DIAGNOSIS — R109 Unspecified abdominal pain: Secondary | ICD-10-CM | POA: Diagnosis not present

## 2021-06-14 DIAGNOSIS — R112 Nausea with vomiting, unspecified: Secondary | ICD-10-CM | POA: Diagnosis not present

## 2021-06-14 LAB — CBC
HCT: 46.8 % — ABNORMAL HIGH (ref 36.0–46.0)
Hemoglobin: 14.7 g/dL (ref 12.0–15.0)
MCH: 30.2 pg (ref 26.0–34.0)
MCHC: 31.4 g/dL (ref 30.0–36.0)
MCV: 96.3 fL (ref 80.0–100.0)
Platelets: 197 10*3/uL (ref 150–400)
RBC: 4.86 MIL/uL (ref 3.87–5.11)
RDW: 13.1 % (ref 11.5–15.5)
WBC: 7.8 10*3/uL (ref 4.0–10.5)
nRBC: 0 % (ref 0.0–0.2)

## 2021-06-14 LAB — COMPREHENSIVE METABOLIC PANEL
ALT: 18 U/L (ref 0–44)
AST: 26 U/L (ref 15–41)
Albumin: 3.8 g/dL (ref 3.5–5.0)
Alkaline Phosphatase: 68 U/L (ref 38–126)
Anion gap: 9 (ref 5–15)
BUN: 20 mg/dL (ref 8–23)
CO2: 25 mmol/L (ref 22–32)
Calcium: 9.2 mg/dL (ref 8.9–10.3)
Chloride: 103 mmol/L (ref 98–111)
Creatinine, Ser: 0.67 mg/dL (ref 0.44–1.00)
GFR, Estimated: >60 mL/min (ref >60)
Glucose, Bld: 130 mg/dL — ABNORMAL HIGH (ref 70–99)
Potassium: 4.5 mmol/L (ref 3.5–5.1)
Sodium: 137 mmol/L (ref 135–145)
Total Bilirubin: 0.8 mg/dL (ref 0.3–1.2)
Total Protein: 6.7 g/dL (ref 6.5–8.1)

## 2021-06-14 LAB — LIPASE, BLOOD: Lipase: 35 U/L (ref 11–51)

## 2021-06-14 NOTE — ED Notes (Signed)
Pt unable to void at this time. 

## 2021-06-14 NOTE — ED Triage Notes (Signed)
Pt has abd pain with nausea.  Pt also has vomiting of green and yellow bile  Pt has lower back pain.  Pt alert  speech clear.    ?

## 2021-06-15 ENCOUNTER — Emergency Department
Admission: EM | Admit: 2021-06-15 | Discharge: 2021-06-15 | Disposition: A | Discharge status: Home / Self Care | Payer: Medicare PPO | Attending: Emergency Medicine | Admitting: Emergency Medicine

## 2021-06-15 ENCOUNTER — Emergency Department: Payer: Medicare PPO

## 2021-06-15 DIAGNOSIS — R1084 Generalized abdominal pain: Secondary | ICD-10-CM

## 2021-06-15 DIAGNOSIS — K529 Noninfective gastroenteritis and colitis, unspecified: Secondary | ICD-10-CM | POA: Diagnosis not present

## 2021-06-15 DIAGNOSIS — R112 Nausea with vomiting, unspecified: Secondary | ICD-10-CM

## 2021-06-15 DIAGNOSIS — R1110 Vomiting, unspecified: Secondary | ICD-10-CM | POA: Diagnosis not present

## 2021-06-15 DIAGNOSIS — A084 Viral intestinal infection, unspecified: Secondary | ICD-10-CM

## 2021-06-15 DIAGNOSIS — R109 Unspecified abdominal pain: Secondary | ICD-10-CM | POA: Diagnosis not present

## 2021-06-15 DIAGNOSIS — I700 Atherosclerosis of aorta: Secondary | ICD-10-CM | POA: Diagnosis not present

## 2021-06-15 LAB — URINALYSIS, ROUTINE W REFLEX MICROSCOPIC
Bilirubin Urine: NEGATIVE
Glucose, UA: NEGATIVE mg/dL
Hgb urine dipstick: NEGATIVE
Ketones, ur: NEGATIVE mg/dL
Leukocytes,Ua: NEGATIVE
Nitrite: NEGATIVE
Protein, ur: NEGATIVE mg/dL
Specific Gravity, Urine: 1.025 (ref 1.005–1.030)
pH: 5 (ref 5.0–8.0)

## 2021-06-15 MED ORDER — SODIUM CHLORIDE 0.9 % IV BOLUS (SEPSIS)
1000.0000 mL | Freq: Once | INTRAVENOUS | Status: AC
  Administered 2021-06-15: 1000 mL via INTRAVENOUS

## 2021-06-15 MED ORDER — ONDANSETRON 4 MG PO TBDP: 4.0000 mg | ORAL_TABLET | Freq: Four times a day (QID) | ORAL | 0 refills | Status: DC | PRN

## 2021-06-15 MED ORDER — ONDANSETRON HCL 4 MG/2ML IJ SOLN: 4.0000 mg | Freq: Once | INTRAMUSCULAR | Status: DC

## 2021-06-15 MED ORDER — IOHEXOL 300 MG/ML  SOLN
100.0000 mL | Freq: Once | INTRAMUSCULAR | Status: AC | PRN
  Administered 2021-06-15: 100 mL via INTRAVENOUS

## 2021-06-15 NOTE — Discharge Instructions (Addendum)
You may use Tylenol 1000 mg every 6 hours as needed for fever and pain.  You may use over-the-counter Imodium as needed for diarrhea. °

## 2021-06-15 NOTE — ED Provider Notes (Signed)
? ?[facility] ?Provider Note ? ? ? Event Date/Time  ? First MD Initiated Contact with Patient 06/15/21 0116   ?  (approximate) ? ? ?History  ? ?Abdominal Pain ? ? ?HPI ? ?Dana Arnold is a 71 y.o. female with history of hyperlipidemia, chronic abdominal pain and constipation, pelvic floor dyssynergy being followed by gastroenterology at [facility] who presents to the emergency department with concerns for bowel obstruction.  States she has had diffuse abdominal pain, distention today and 2 episodes of bilious vomiting.  She has had a previous cholecystectomy that she states was "botched" and she has scar tissue.  She states that she is being followed by gastroenterology at [facility] because she has a colon that is "longer than normal" and has history of malrotation.  She states she has had symptoms similar to bowel obstructions before but normally they self resolve and she does not require hospitalization.  She has never had to have an NG tube.  She states she did have a small bowel movement around 2 PM today but has not been passing gas.  States her pain has improved since earlier today.  States she has never vomited with the symptoms before.  No fever.  No urinary symptoms. ? ? ?History provided by patient and husband. ? ? ? ?Past Medical History:  ?Diagnosis Date  ? Cervical radiculitis   ? Cervicalgia   ? Chondromalacia   ? Chronic constipation   ? Degeneration of lumbar or lumbosacral intervertebral disc   ? External hemorrhoid, thrombosed   ? Gastritis, chronic   ? GERD (gastroesophageal reflux disease)   ? Hyperglycemia   ? Hyperlipidemia   ? Lupus anticoagulant positive   ? Olecranon bursitis   ? Osteopenia   ? Ovarian failure   ? Paresthesia   ? Vitamin D deficiency   ? ? ?Past Surgical History:  ?Procedure Laterality Date  ? arthroscopic knee Right 05/26/2014  ? CHOLECYSTECTOMY  02/28/1989  ? DILATION AND CURETTAGE OF UTERUS    ? HIP PINNING,CANNULATED Left 08/07/2020  ? Procedure: Left hip  percutaneous pinning;  Surgeon: Signa Kell, MD;  Location: [facility] ORS;  Service: Orthopedics;  Laterality: Left;  ? right ulna surgery Right 07/01/2010  ? TONSILLECTOMY AND ADENOIDECTOMY Bilateral 03/01/1959  ? ? ?MEDICATIONS:  ?Prior to Admission medications   ?Medication Sig Start Date End Date Taking? Authorizing Provider  ?blood glucose meter kit and supplies Dispense based on patient and insurance preference. Use up to four times daily as directed. Hypoglycemia ?episodes 11/27/18   Alba Cory, MD  ?Cholecalciferol (VITAMIN D) 2000 units tablet Take 3,000 Units by mouth daily.    [provider]  ?cyanocobalamin 1000 MCG tablet Take 1,000 mcg by mouth 3 (three) times a week.    [provider]  ?Magnesium 200 MG TABS Take 400 mg by mouth daily.    [provider]  ?Misc Natural Products (TURMERIC CURCUMIN) CAPS Take 2 capsules by mouth daily.    [provider]  ?neomycin-polymyxin b-dexamethasone (MAXITROL) 3.5-10000-0.1 OINT SMARTSIG:Sparingly In Eye(s) 3 Times Daily 03/10/21   [provider]  ? ? ?Physical Exam  ? ?Triage Vital Signs: ?ED Triage Vitals  ?Enc Vitals Group  ?   BP 06/14/21 2018 (!) 125/58  ?   Pulse Rate 06/14/21 2018 (!) 110  ?   Resp 06/14/21 2018 18  ?   Temp 06/14/21 2018 99 ?F (37.2 ?C)  ?   Temp Source 06/14/21 2018 Oral  ?  SpO2 06/14/21 2018 96 %  ?   Weight 06/14/21 2015 130 lb (59 kg)  ?   Height 06/14/21 2015 $RemoveBefor'5\' 4"'fZbGUswaFuvO$  (1.626 m)  ?   Head Circumference --   ?   Peak Flow --   ?   Pain Score 06/14/21 2015 3  ?   Pain Loc --   ?   Pain Edu? --   ?   Excl. in [place]? --   ? ? ?Most recent vital signs: ?Vitals:  ? 06/15/21 0125 06/15/21 0200  ?BP: (!) 141/71 119/63  ?Pulse: 85 76  ?Resp: 19 17  ?Temp:    ?SpO2: 100% 98%  ? ? ?CONSTITUTIONAL: Alert and oriented and responds appropriately to questions. Well-appearing; well-nourished ?HEAD: Normocephalic, atraumatic ?EYES: Conjunctivae clear, pupils appear equal, sclera nonicteric ?ENT: normal nose;  moist mucous membranes ?NECK: Supple, normal ROM ?CARD: RRR; S1 and S2 appreciated; no murmurs, no clicks, no rubs, no gallops ?RESP: Normal chest excursion without splinting or tachypnea; breath sounds clear and equal bilaterally; no wheezes, no rhonchi, no rales, no hypoxia or respiratory distress, speaking full sentences ?ABD/GI: Nondistended but slightly tender to palpation diffusely without guarding or rebound, soft ?BACK: The back appears normal ?EXT: Normal ROM in all joints; no deformity noted, no edema; no cyanosis ?SKIN: Normal color for age and race; warm; no rash on exposed skin ?NEURO: Moves all extremities equally, normal speech ?PSYCH: The patient's mood and manner are appropriate. ? ? ?ED Results / Procedures / Treatments  ? ?LABS: ?(all labs ordered are listed, but only abnormal results are displayed) ?Labs Reviewed  ?COMPREHENSIVE METABOLIC PANEL - Abnormal; Notable for the following components:  ?    Result Value  ? Glucose, Bld 130 (*)   ? All other components within normal limits  ?CBC - Abnormal; Notable for the following components:  ? HCT 46.8 (*)   ? All other components within normal limits  ?URINALYSIS, ROUTINE W REFLEX MICROSCOPIC - Abnormal; Notable for the following components:  ? Color, Urine YELLOW (*)   ? APPearance HAZY (*)   ? All other components within normal limits  ?LIPASE, BLOOD  ? ? ? ?EKG: ? ? ?RADIOLOGY: ?My personal review and interpretation of imaging: CT scan shows findings consistent with viral gastroenteritis. ? ?I have personally reviewed all radiology reports.   ?CT ABDOMEN PELVIS W CONTRAST ? ?Result Date: 06/15/2021 ?CLINICAL DATA:  Vomiting, nausea and abdominal pain. EXAM: CT ABDOMEN AND PELVIS WITH CONTRAST TECHNIQUE: Multidetector CT imaging of the abdomen and pelvis was performed using the standard protocol following bolus administration of intravenous contrast. RADIATION DOSE REDUCTION: This exam was performed according to the departmental dose-optimization  program which includes automated exposure control, adjustment of the mA and/or kV according to patient size and/or use of iterative reconstruction technique. CONTRAST:  164mL OMNIPAQUE IOHEXOL 300 MG/ML  SOLN COMPARISON:  Similar study 05/05/2019 FINDINGS: Lower chest: No acute abnormality. Chronic elevation right hemidiaphragm. Hepatobiliary: The liver is mildly prominent, mildly steatotic but without mass enhancement. A 1.5 cm flash filling hemangioma is again noted in the posterior segment of the right lobe. Gallbladder is absent and mild chronically prominent common bile duct and central intrahepatic bile ducts are again noted but no more than previously. Pancreas: There is no mass enhancement. Mild ductal dilatation is unchanged. No adjacent inflammation. Spleen: Unremarkable.  Normal in size. Adrenals/Urinary Tract: There is no adrenal mass, no renal cortical mass enhancement. There are bilateral extrarenal pelves pooling the contrast in the delayed phase but  no evidence of intrarenal caliectasis or ureterectasis. There is no bladder thickening. Stomach/Bowel: There are thickened folds in the proximal/midportion of the stomach. Additional thickened folds in the third duodenal segment with fluid-filled normal caliber mid to lower abdominal small bowel with some segments showing mucosal enhancement without thickening. There is fluid in the colon, without wall thickening. An appendix is not seen in this patient. Bowel nonrotation is again shown with the colon in the central and left abdomen, majority of the small bowel in the right abdomen including the duodenal jejunal junction. Vascular/Lymphatic: Aortic atherosclerosis. No enlarged abdominal or pelvic lymph nodes. Left pelvic venous congestion is again noted. No adenopathy is seen. Reproductive: The uterus is intact.  No adnexal mass is evident. Other: There is no free air, hemorrhage or fluid. There is no incarcerated hernia. Musculoskeletal: There are  degenerative changes of the spine and mild lumbar dextrorotary scoliosis. Interval new demonstration of 3 threaded left hip pins with healed fracture deformity in the femoral neck with shortening and mild secondar

## 2021-06-15 NOTE — ED Notes (Signed)
Patient tolerated PO fluids without incidence. No vomiting observed. ?

## 2021-06-17 ENCOUNTER — Encounter: Payer: Self-pay | Admitting: Family Medicine

## 2021-06-17 DIAGNOSIS — K760 Fatty (change of) liver, not elsewhere classified: Secondary | ICD-10-CM | POA: Insufficient documentation

## 2021-06-17 DIAGNOSIS — I700 Atherosclerosis of aorta: Secondary | ICD-10-CM | POA: Insufficient documentation

## 2021-06-28 ENCOUNTER — Telehealth: Payer: Self-pay | Admitting: Family Medicine

## 2021-06-28 NOTE — Telephone Encounter (Signed)
Copied from CRM [id number]. Topic: Medicare AWV ?>> Jun 28, 2021  3:08 PM Claudette Laws R wrote: ?Reason for CRM:  ? ?Left message for patient to call back and schedule Medicare Annual Wellness Visit (AWV) in office.  ? ?If unable to come into the office for AWV,  please offer to do virtually or by telephone. ? ?No hx of AWV eligible for AWVI per [employer] as of 08/28/2020  ? ?Please schedule at anytime with [facility] Health Advisor.     ? ?45 minute appointment  ? ?Any questions, please call me at [phone] ?

## 2021-07-02 DIAGNOSIS — M6289 Other specified disorders of muscle: Secondary | ICD-10-CM | POA: Diagnosis not present

## 2021-07-02 DIAGNOSIS — R278 Other lack of coordination: Secondary | ICD-10-CM | POA: Diagnosis not present

## 2021-07-02 DIAGNOSIS — K5902 Outlet dysfunction constipation: Secondary | ICD-10-CM | POA: Diagnosis not present

## 2021-07-07 DIAGNOSIS — M6289 Other specified disorders of muscle: Secondary | ICD-10-CM | POA: Diagnosis not present

## 2021-07-07 DIAGNOSIS — K5902 Outlet dysfunction constipation: Secondary | ICD-10-CM | POA: Diagnosis not present

## 2021-07-07 DIAGNOSIS — R278 Other lack of coordination: Secondary | ICD-10-CM | POA: Diagnosis not present

## 2021-07-08 ENCOUNTER — Ambulatory Visit (INDEPENDENT_AMBULATORY_CARE_PROVIDER_SITE_OTHER): Payer: Medicare PPO

## 2021-07-08 DIAGNOSIS — Z0000 Encounter for general adult medical examination without abnormal findings: Secondary | ICD-10-CM

## 2021-07-08 NOTE — Patient Instructions (Signed)
Dana Arnold , ?Thank you for taking time to come for your Medicare Wellness Visit. I appreciate your ongoing commitment to your health goals. Please review the following plan we discussed and let me know if I can assist you in the future.  ? ?Screening recommendations/referrals: ?Colonoscopy: done 12/15/17. Repeat 11/2027 ?Mammogram: done 03/03/21 ?Bone Density: done 01/01/19. Scheduled for 07/22/21 ?Recommended yearly ophthalmology/optometry visit for glaucoma screening and checkup ?Recommended yearly dental visit for hygiene and checkup ? ?Vaccinations: ?Influenza vaccine: declined ?Pneumococcal vaccine: declined ?Tdap vaccine: done 12/24/14 ?Shingles vaccine: done 08/02/18 & 11/27/18   ?Covid-19:done 03/19/19, 04/11/19, 12/18/19 & 02/04/21 ? ?Advanced directives: Please bring a copy of your health care power of attorney and living will to the office at your convenience.  ? ?Conditions/risks identified: Keep up the great work! ? ?Next appointment: Follow up in one year for your annual wellness visit  ? ? ?Preventive Care 50 Years and Older, Female ?Preventive care refers to lifestyle choices and visits with your health care provider that can promote health and wellness. ?What does preventive care include? ?A yearly physical exam. This is also called an annual well check. ?Dental exams once or twice a year. ?Routine eye exams. Ask your health care provider how often you should have your eyes checked. ?Personal lifestyle choices, including: ?Daily care of your teeth and gums. ?Regular physical activity. ?Eating a healthy diet. ?Avoiding tobacco and drug use. ?Limiting alcohol use. ?Practicing safe sex. ?Taking low-dose aspirin every day. ?Taking vitamin and mineral supplements as recommended by your health care provider. ?What happens during an annual well check? ?The services and screenings done by your health care provider during your annual well check will depend on your age, overall health, lifestyle risk factors, and family  history of disease. ?Counseling  ?Your health care provider may ask you questions about your: ?Alcohol use. ?Tobacco use. ?Drug use. ?Emotional well-being. ?Home and relationship well-being. ?Sexual activity. ?Eating habits. ?History of falls. ?Memory and ability to understand (cognition). ?Work and work Astronomer. ?Reproductive health. ?Screening  ?You may have the following tests or measurements: ?Height, weight, and BMI. ?Blood pressure. ?Lipid and cholesterol levels. These may be checked every 5 years, or more frequently if you are over 2 years old. ?Skin check. ?Lung cancer screening. You may have this screening every year starting at age 41 if you have a 30-pack-year history of smoking and currently smoke or have quit within the past 15 years. ?Fecal occult blood test (FOBT) of the stool. You may have this test every year starting at age 74. ?Flexible sigmoidoscopy or colonoscopy. You may have a sigmoidoscopy every 5 years or a colonoscopy every 10 years starting at age 55. ?Hepatitis C blood test. ?Hepatitis B blood test. ?Sexually transmitted disease (STD) testing. ?Diabetes screening. This is done by checking your blood sugar (glucose) after you have not eaten for a while (fasting). You may have this done every 1-3 years. ?Bone density scan. This is done to screen for osteoporosis. You may have this done starting at age 37. ?Mammogram. This may be done every 1-2 years. Talk to your health care provider about how often you should have regular mammograms. ?Talk with your health care provider about your test results, treatment options, and if necessary, the need for more tests. ?Vaccines  ?Your health care provider may recommend certain vaccines, such as: ?Influenza vaccine. This is recommended every year. ?Tetanus, diphtheria, and acellular pertussis (Tdap, Td) vaccine. You may need a Td booster every 10 years. ?Zoster vaccine. You  may need this after age 55. ?Pneumococcal 13-valent conjugate (PCV13)  vaccine. One dose is recommended after age 60. ?Pneumococcal polysaccharide (PPSV23) vaccine. One dose is recommended after age 30. ?Talk to your health care provider about which screenings and vaccines you need and how often you need them. ?This information is not intended to replace advice given to you by your health care provider. Make sure you discuss any questions you have with your health care provider. ?Document Released: 03/13/2015 Document Revised: 11/04/2015 Document Reviewed: 12/16/2014 ?Elsevier Interactive Patient Education ? 2017 [place]. ? ?Fall Prevention in the Home ?Falls can cause injuries. They can happen to people of all ages. There are many things you can do to make your home safe and to help prevent falls. ?What can I do on the outside of my home? ?Regularly fix the edges of walkways and driveways and fix any cracks. ?Remove anything that might make you trip as you walk through a door, such as a raised step or threshold. ?Trim any bushes or trees on the path to your home. ?Use bright outdoor lighting. ?Clear any walking paths of anything that might make someone trip, such as rocks or tools. ?Regularly check to see if handrails are loose or broken. Make sure that both sides of any steps have handrails. ?Any raised decks and porches should have guardrails on the edges. ?Have any leaves, snow, or ice cleared regularly. ?Use sand or salt on walking paths during winter. ?Clean up any spills in your garage right away. This includes oil or grease spills. ?What can I do in the bathroom? ?Use night lights. ?Install grab bars by the toilet and in the tub and shower. Do not use towel bars as grab bars. ?Use non-skid mats or decals in the tub or shower. ?If you need to sit down in the shower, use a plastic, non-slip stool. ?Keep the floor dry. Clean up any water that spills on the floor as soon as it happens. ?Remove soap buildup in the tub or shower regularly. ?Attach bath mats securely with  double-sided non-slip rug tape. ?Do not have throw rugs and other things on the floor that can make you trip. ?What can I do in the bedroom? ?Use night lights. ?Make sure that you have a light by your bed that is easy to reach. ?Do not use any sheets or blankets that are too big for your bed. They should not hang down onto the floor. ?Have a firm chair that has side arms. You can use this for support while you get dressed. ?Do not have throw rugs and other things on the floor that can make you trip. ?What can I do in the kitchen? ?Clean up any spills right away. ?Avoid walking on wet floors. ?Keep items that you use a lot in easy-to-reach places. ?If you need to reach something above you, use a strong step stool that has a grab bar. ?Keep electrical cords out of the way. ?Do not use floor polish or wax that makes floors slippery. If you must use wax, use non-skid floor wax. ?Do not have throw rugs and other things on the floor that can make you trip. ?What can I do with my stairs? ?Do not leave any items on the stairs. ?Make sure that there are handrails on both sides of the stairs and use them. Fix handrails that are broken or loose. Make sure that handrails are as long as the stairways. ?Check any carpeting to make sure that it is firmly  attached to the stairs. Fix any carpet that is loose or worn. ?Avoid having throw rugs at the top or bottom of the stairs. If you do have throw rugs, attach them to the floor with carpet tape. ?Make sure that you have a light switch at the top of the stairs and the bottom of the stairs. If you do not have them, ask someone to add them for you. ?What else can I do to help prevent falls? ?Wear shoes that: ?Do not have high heels. ?Have rubber bottoms. ?Are comfortable and fit you well. ?Are closed at the toe. Do not wear sandals. ?If you use a stepladder: ?Make sure that it is fully opened. Do not climb a closed stepladder. ?Make sure that both sides of the stepladder are locked  into place. ?Ask someone to hold it for you, if possible. ?Clearly mark and make sure that you can see: ?Any grab bars or handrails. ?First and last steps. ?Where the edge of each step is. ?Use tools that help you

## 2021-07-08 NOTE — Progress Notes (Signed)
? ?Subjective:  ? Dana Arnold is a 71 y.o. female who presents for an Initial Medicare Annual Wellness Visit. ? ?Virtual Visit via Telephone Note ? ?I connected with  Theodoro Clock on 07/08/21 at 10:45 AM EDT by telephone and verified that I am speaking with the correct person using two identifiers. ? ?Location: ?Patient: home ?Provider: [place] ?Persons participating in the virtual visit: patient/Nurse Health Advisor ?  ?I discussed the limitations, risks, security and privacy concerns of performing an evaluation and management service by telephone and the availability of in person appointments. The patient expressed understanding and agreed to proceed. ? ?Interactive audio and video telecommunications were attempted between this nurse and patient, however failed, due to patient having technical difficulties OR patient did not have access to video capability.  We continued and completed visit with audio only. ? ?Some vital signs may be absent or patient reported.  ? ?Clemetine Marker, LPN ? ? ?Review of Systems    ? ?Cardiac Risk Factors include: advanced age (>47men, >56 women) ? ?   ?Objective:  ?  ?There were no vitals filed for this visit. ?There is no height or weight on file to calculate BMI. ? ? ?  07/08/2021  ? 10:52 AM 06/14/2021  ?  8:16 PM 08/06/2020  ? 10:22 AM 05/05/2019  ? 11:32 AM 08/02/2016  ?  8:38 AM 04/22/2016  ? 11:02 AM 05/14/2015  ? 10:17 AM  ?Advanced Directives  ?Does Patient Have a Medical Advance Directive? Yes No No No No No No  ?Type of Paramedic of [place];Living will        ?Copy of [place] in Chart? No - copy requested        ?Would patient like information on creating a medical advance directive?   No - Patient declined No - Patient declined     ? ? ?Current Medications (verified) ?Outpatient Encounter Medications as of 07/08/2021  ?Medication Sig  ? blood glucose meter kit and supplies Dispense based on patient and insurance preference. Use up to four  times daily as directed. Hypoglycemia ?episodes  ? Cholecalciferol (VITAMIN D) 2000 units tablet Take 3,000 Units by mouth daily.  ? cyanocobalamin 1000 MCG tablet Take 1,000 mcg by mouth 3 (three) times a week.  ? Magnesium 200 MG TABS Take 400 mg by mouth daily.  ? Misc Natural Products (TURMERIC CURCUMIN) CAPS Take 2 capsules by mouth daily.  ? [DISCONTINUED] Multiple Vitamins-Minerals (DIALYVITE 800/ULTRA D) TABS 1 capsule  ? [DISCONTINUED] neomycin-polymyxin b-dexamethasone (MAXITROL) 3.5-10000-0.1 OINT SMARTSIG:Sparingly In Eye(s) 3 Times Daily  ? [DISCONTINUED] ondansetron (ZOFRAN-ODT) 4 MG disintegrating tablet Take 1 tablet (4 mg total) by mouth every 6 (six) hours as needed for nausea or vomiting.  ? ?No facility-administered encounter medications on file as of 07/08/2021.  ? ? ?Allergies (verified) ?Terazol 3 [terconazole] and Macrobid [nitrofurantoin macrocrystal]  ? ?History: ?Past Medical History:  ?Diagnosis Date  ? Cervical radiculitis   ? Cervicalgia   ? Chondromalacia   ? Chronic constipation   ? Degeneration of lumbar or lumbosacral intervertebral disc   ? External hemorrhoid, thrombosed   ? Gastritis, chronic   ? GERD (gastroesophageal reflux disease)   ? Hyperglycemia   ? Hyperlipidemia   ? Lupus anticoagulant positive   ? Olecranon bursitis   ? Osteopenia   ? Ovarian failure   ? Paresthesia   ? Vitamin D deficiency   ? ?Past Surgical History:  ?Procedure Laterality Date  ? arthroscopic  knee Right 05/26/2014  ? CHOLECYSTECTOMY  02/28/1989  ? DILATION AND CURETTAGE OF UTERUS    ? HIP PINNING,CANNULATED Left 08/07/2020  ? Procedure: Left hip percutaneous pinning;  Surgeon: Leim Fabry, MD;  Location: [facility] ORS;  Service: Orthopedics;  Laterality: Left;  ? right ulna surgery Right 07/01/2010  ? TONSILLECTOMY AND ADENOIDECTOMY Bilateral 03/01/1959  ? ?Family History  ?Problem Relation Age of Onset  ? Osteoporosis Mother   ? Aneurysm Father   ? ?Social History  ? ?Socioeconomic History  ? Marital  status: Married  ?  Spouse name: Eddie Dibbles   ? Number of children: 3  ? Years of education: Not on file  ? Highest education level: Associate degree: occupational, Hotel manager, or vocational program  ?Occupational History  ? Occupation:  Pharmacist, hospital assistance   ?  Comment: [employer]   ?Tobacco Use  ? Smoking status: Never  ? Smokeless tobacco: Never  ?Vaping Use  ? Vaping Use: Never used  ?Substance and Sexual Activity  ? Alcohol use: No  ?  Alcohol/week: 0.0 standard drinks  ? Drug use: No  ? Sexual activity: Yes  ?  Partners: Male  ?  Birth control/protection: Post-menopausal  ?Other Topics Concern  ? Not on file  ?Social History Narrative  ? Not on file  ? ?Social Determinants of Health  ? ?Financial Resource Strain: Low Risk   ? Difficulty of Paying Living Expenses: Not hard at all  ?Food Insecurity: No Food Insecurity  ? Worried About Charity fundraiser in the Last Year: Never true  ? Ran Out of Food in the Last Year: Never true  ?Transportation Needs: No Transportation Needs  ? Lack of Transportation (Medical): No  ? Lack of Transportation (Non-Medical): No  ?Physical Activity: Sufficiently Active  ? Days of Exercise per Week: 3 days  ? Minutes of Exercise per Session: 50 min  ?Stress: No Stress Concern Present  ? Feeling of Stress : Not at all  ?Social Connections: Socially Integrated  ? Frequency of Communication with Friends and Family: More than three times a week  ? Frequency of Social Gatherings with Friends and Family: More than three times a week  ? Attends Religious Services: More than 4 times per year  ? Active Member of Clubs or Organizations: Yes  ? Attends Archivist Meetings: More than 4 times per year  ? Marital Status: Married  ? ? ?Tobacco Counseling ?Counseling given: Not Answered ? ? ?Clinical Intake: ? ?Pre-visit preparation completed: Yes ? ?Pain : No/denies pain ? ?  ? ?Nutritional Risks: None ?Diabetes: No ? ?How often do you need to have someone help you when you read instructions,  pamphlets, or other written materials from your doctor or pharmacy?: 1 - Never ? ? ?Interpreter Needed?: No ? ?Information entered by :: Clemetine Marker LPN ? ? ?Activities of Daily Living ? ?  07/08/2021  ? 10:54 AM 04/14/2021  ?  8:59 AM  ?In your present state of health, do you have any difficulty performing the following activities:  ?Hearing? 0 0  ?Vision? 0 0  ?Difficulty concentrating or making decisions? 0 0  ?Walking or climbing stairs? 0 0  ?Dressing or bathing? 0 0  ?Doing errands, shopping? 0 0  ?Preparing Food and eating ? N   ?Using the Toilet? N   ?In the past six months, have you accidently leaked urine? N   ?Do you have problems with loss of bowel control? N   ?Managing your Medications? N   ?  Managing your Finances? N   ?Housekeeping or managing your Housekeeping? N   ? ? ?Patient Care Team: ?Steele Sizer, MD as PCP - General (Family Medicine) ?Oneta Rack, MD (Dermatology) ?Kathreen Cosier, MD (Gastroenterology) ?Everlene Farrier, MD as Consulting Physician (Obstetrics and Gynecology) ?Judi Cong, MD as Physician Assistant (Endocrinology) ? ?Indicate any recent Medical Services you may have received from other than [facility] providers in the past year (date may be approximate). ? ?   ?Assessment:  ? This is a routine wellness examination for Tineshia. ? ?Hearing/Vision screen ?Hearing Screening - Comments:: Pt denies hearing difficulty ? ?Vision Screening - Comments:: Annual vision screenings done by [facility]  ? ?Dietary issues and exercise activities discussed: ?Current Exercise Habits: Home exercise routine, Type of exercise: walking;strength training/weights;stretching, Time (Minutes): 40, Frequency (Times/Week): 3, Weekly Exercise (Minutes/Week): 120, Intensity: Moderate, Exercise limited by: orthopedic condition(s) ? ? Goals Addressed   ? ?  ?  ?  ?  ? This Visit's Progress  ?  Increase physical activity     ?  Pt would like to walk 2 miles at least 3 days per week  ?  ? ?   ? ?Depression Screen ? ?  07/08/2021  ? 10:52 AM 04/14/2021  ?  8:59 AM 10/14/2020  ?  8:52 AM 04/21/2020  ?  9:01 AM 10/23/2019  ? 11:29 AM 12/11/2018  ?  9:47 AM 11/27/2018  ?  7:53 AM  ?PHQ 2/9 Scores  ?PHQ - 2 Score 0

## 2021-07-12 DIAGNOSIS — K5902 Outlet dysfunction constipation: Secondary | ICD-10-CM | POA: Diagnosis not present

## 2021-07-12 DIAGNOSIS — M6289 Other specified disorders of muscle: Secondary | ICD-10-CM | POA: Diagnosis not present

## 2021-07-12 DIAGNOSIS — R278 Other lack of coordination: Secondary | ICD-10-CM | POA: Diagnosis not present

## 2021-07-13 ENCOUNTER — Other Ambulatory Visit: Payer: Medicare PPO

## 2021-07-19 DIAGNOSIS — R278 Other lack of coordination: Secondary | ICD-10-CM | POA: Diagnosis not present

## 2021-07-19 DIAGNOSIS — M6289 Other specified disorders of muscle: Secondary | ICD-10-CM | POA: Diagnosis not present

## 2021-07-19 DIAGNOSIS — K5902 Outlet dysfunction constipation: Secondary | ICD-10-CM | POA: Diagnosis not present

## 2021-07-22 ENCOUNTER — Ambulatory Visit
Admission: RE | Admit: 2021-07-22 | Discharge: 2021-07-22 | Disposition: A | Discharge status: Home / Self Care | Payer: Medicare PPO | Source: Ambulatory Visit | Attending: Family Medicine | Admitting: Family Medicine

## 2021-07-22 DIAGNOSIS — M8588 Other specified disorders of bone density and structure, other site: Secondary | ICD-10-CM | POA: Diagnosis not present

## 2021-07-22 DIAGNOSIS — M810 Age-related osteoporosis without current pathological fracture: Secondary | ICD-10-CM | POA: Insufficient documentation

## 2021-07-22 DIAGNOSIS — Z780 Asymptomatic menopausal state: Secondary | ICD-10-CM | POA: Diagnosis not present

## 2021-08-02 ENCOUNTER — Other Ambulatory Visit: Payer: Medicare PPO

## 2021-08-02 DIAGNOSIS — K5902 Outlet dysfunction constipation: Secondary | ICD-10-CM | POA: Diagnosis not present

## 2021-08-02 DIAGNOSIS — M6289 Other specified disorders of muscle: Secondary | ICD-10-CM | POA: Diagnosis not present

## 2021-08-02 DIAGNOSIS — R278 Other lack of coordination: Secondary | ICD-10-CM | POA: Diagnosis not present

## 2021-08-20 DIAGNOSIS — J301 Allergic rhinitis due to pollen: Secondary | ICD-10-CM | POA: Diagnosis not present

## 2021-08-20 DIAGNOSIS — H9313 Tinnitus, bilateral: Secondary | ICD-10-CM | POA: Diagnosis not present

## 2021-08-20 DIAGNOSIS — H903 Sensorineural hearing loss, bilateral: Secondary | ICD-10-CM | POA: Diagnosis not present

## 2021-08-27 DIAGNOSIS — D2271 Melanocytic nevi of right lower limb, including hip: Secondary | ICD-10-CM | POA: Diagnosis not present

## 2021-08-27 DIAGNOSIS — L821 Other seborrheic keratosis: Secondary | ICD-10-CM | POA: Diagnosis not present

## 2021-08-27 DIAGNOSIS — D225 Melanocytic nevi of trunk: Secondary | ICD-10-CM | POA: Diagnosis not present

## 2021-08-27 DIAGNOSIS — D2272 Melanocytic nevi of left lower limb, including hip: Secondary | ICD-10-CM | POA: Diagnosis not present

## 2021-08-27 DIAGNOSIS — D2261 Melanocytic nevi of right upper limb, including shoulder: Secondary | ICD-10-CM | POA: Diagnosis not present

## 2021-08-27 DIAGNOSIS — D2262 Melanocytic nevi of left upper limb, including shoulder: Secondary | ICD-10-CM | POA: Diagnosis not present

## 2021-09-06 DIAGNOSIS — M1711 Unilateral primary osteoarthritis, right knee: Secondary | ICD-10-CM | POA: Diagnosis not present

## 2021-09-06 DIAGNOSIS — M25561 Pain in right knee: Secondary | ICD-10-CM | POA: Diagnosis not present

## 2021-09-08 DIAGNOSIS — K5902 Outlet dysfunction constipation: Secondary | ICD-10-CM | POA: Diagnosis not present

## 2021-09-08 DIAGNOSIS — M6289 Other specified disorders of muscle: Secondary | ICD-10-CM | POA: Diagnosis not present

## 2021-09-08 DIAGNOSIS — R278 Other lack of coordination: Secondary | ICD-10-CM | POA: Diagnosis not present

## 2021-09-22 DIAGNOSIS — K5902 Outlet dysfunction constipation: Secondary | ICD-10-CM | POA: Diagnosis not present

## 2021-10-12 NOTE — Progress Notes (Unsigned)
Name: SOPHIANA MILANESE   MRN: [id number]    DOB: 1950-03-13   Date:10/13/2021       Progress Note  Subjective  Chief Complaint  Follow Up  HPI  Subclinical hypothyroidism: last TSH was 6.5, last time we checked down to 4 51 . She denies  dry skin, denies dysphagia.   Intestinal malformation: found on CT of abdomen for evaluation of abdominal [facility] at [facility]  , also found to have fatty liver , she was diagnosed with gastroenteritis. She denies any pain or discomfort at this time, no change in bowel movements She is still seeing GI    Low Vitamin D level last level was normal, taking supplementation , discussed high calcium diet again   B12 insufficieny: she has been taking SL supplementation three times a week.   Pre-diabetes : she has changed her diet , avoiding sweets, states no recent hypoglycemic episodes last A1C was down from 5.7 % to 5.4 %  Denies polyphagia, polydipsia or polyuria. .  Dyslipidemia: last LDL was 141 but she has atherosclerosis of aorta - seen on CT abdomen pelvis. Discussed statin therapy   Chronic constipation: taking miralax, had pelvic PT and is doing better, sees GI  Osteoporosis: right femur was -2.5 %, she is not taking calcium she is on vitamin D , discussed options of therapy, oral, injectables , she was seen by Endo Dec 2022  and is considering Prolia.  She is on regular physical activity and vitamin D supplementation.She did not start injections yet Explained increase risk of fractures  Constipation : seeing GI at [facility] and diagnosed with pelvic floor dyssynergia and is going to start PT next month, she is also taking Miralax and has helped with her bowel movement   Right 3rd toe pain: a can rolled from the counter and fell on 3 rd right toe, she is icing, trying to immobilize and redness and pain is improving   She did a health screen that showed PVC, today during ausculation irregular beats, EKG confirmed frequent PVC's, she would like to see Dr.  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   Gastro-esophageal reflux disease without esophagitis 04/14/2021   Tortuous colon 04/14/2021   Osteoporosis 03/31/2021   History of fracture of left hip 10/14/2020   Osteoarthritis 10/22/2019   Chronic neck pain 04/05/2019   Osteoporosis, post-menopausal 01/03/2019   Scoliosis of lumbar spine 04/22/2016   Thoracic scoliosis 09/08/2015   Chondromalacia 03/10/2015   Hyperglycemia 12/24/2014   Cervical radiculitis 12/24/2014   Subclinical hypothyroidism 12/24/2014   Vitamin D deficiency 12/24/2014   Hx of Clostridium difficile infection 12/24/2014   External hemorrhoid 12/24/2014   Chronic gastric erosion 12/24/2014   Chondrocalcinosis of right knee 12/24/2014   DDD (degenerative disc disease), lumbar 12/24/2014    Past Surgical History:  Procedure Laterality Date   arthroscopic knee Right 05/26/2014   CHOLECYSTECTOMY  02/28/1989   DILATION AND CURETTAGE OF UTERUS     HIP PINNING,CANNULATED Left 08/07/2020   Procedure: Left hip percutaneous pinning;  Surgeon: Leim Fabry, MD;  Location: [facility] ORS;  Service: Orthopedics;  Laterality: Left;   right ulna surgery Right 07/01/2010   TONSILLECTOMY AND ADENOIDECTOMY Bilateral 03/01/1959    Family History  Problem Relation Age of Onset   Osteoporosis Mother  Aneurysm Father     Social History   Tobacco Use   Smoking status: Never   Smokeless tobacco: Never  Substance Use Topics   Alcohol use: No    Alcohol/week: 0.0 standard drinks of alcohol     Current Outpatient Medications:    blood glucose meter kit and supplies, Dispense based on patient and insurance preference. Use up to four times daily as directed. Hypoglycemia episodes, Disp: 1 each,  Rfl: 0   Cholecalciferol (VITAMIN D) 2000 units tablet, Take 3,000 Units by mouth daily., Disp: , Rfl:    cyanocobalamin 1000 MCG tablet, Take 1,000 mcg by mouth 3 (three) times a week., Disp: , Rfl:    Magnesium 200 MG TABS, Take 400 mg by mouth daily., Disp: , Rfl:    Misc Natural Products (TURMERIC CURCUMIN) CAPS, Take 2 capsules by mouth daily., Disp: , Rfl:   Allergies  Allergen Reactions   Terazol 3 [Terconazole] Swelling and Rash   Macrobid [Nitrofurantoin Macrocrystal]     I personally reviewed active problem list, medication list, allergies, family history, social history, health maintenance with the patient/caregiver today.   ROS  Constitutional: Negative for fever or weight change.  Respiratory: Negative for cough and shortness of breath.   Cardiovascular: Negative for chest pain or palpitations.  Gastrointestinal: Negative for abdominal pain, no bowel changes.  Musculoskeletal: Negative for gait problem or joint swelling.  Skin: Negative for rash.  Neurological: Negative for dizziness or headache.  No other specific complaints in a complete review of systems (except as listed in HPI above).   Objective  Vitals:   10/13/21 0817  BP: 124/76  Pulse: 89  Resp: 16  Weight: 130 lb (59 kg)  Height: $Remove'5\' 4"'BJgaKOP$  (1.626 m)    Body mass index is 22.31 kg/m.  Physical Exam  Constitutional: Patient appears well-developed and well-nourished.  No distress.  HEENT: head atraumatic, normocephalic, pupils equal and reactive to light, neck supple Cardiovascular: Normal rate, frequent skipped beats .  No murmur heard. No BLE edema. Pulmonary/Chest: Effort normal and breath sounds normal. No respiratory distress. Abdominal: Soft.  There is no tenderness. Psychiatric: Patient has a normal mood and affect. behavior is normal. Judgment and thought content normal.    PHQ2/9:    10/13/2021    8:16 AM 07/08/2021   10:52 AM 04/14/2021    8:59 AM 10/14/2020    8:52 AM 04/21/2020    9:01  AM  Depression screen PHQ 2/9  Decreased Interest 0 0 0 0 0  Down, Depressed, Hopeless 0 0 0 0 0  PHQ - 2 Score 0 0 0 0 0  Altered sleeping 0      Tired, decreased energy 0      Change in appetite 0      Feeling bad or failure about yourself  0      Trouble concentrating 0      Moving slowly or fidgety/restless 0      Suicidal thoughts 0      PHQ-9 Score 0        phq 9 is negative   Fall Risk:    10/13/2021    8:16 AM 07/08/2021   10:53 AM 04/14/2021    9:02 AM 04/14/2021    8:59 AM 10/14/2020    8:52 AM  Fall Risk   Falls in the past year? 0 1 1 0 1  Number falls in past yr: 0 0 0 0 0  Injury with Fall? 0 1 0 0 1  Risk for fall due to : No Fall Risks History of fall(s)  No Fall Risks   Follow up Falls prevention discussed Falls prevention discussed Falls prevention discussed;Falls evaluation completed Falls prevention discussed       Functional Status Survey: Is the patient deaf or have difficulty hearing?: No Does the patient have difficulty seeing, even when wearing glasses/contacts?: No Does the patient have difficulty concentrating, remembering, or making decisions?: No Does the patient have difficulty walking or climbing stairs?: No Does the patient have difficulty dressing or bathing?: No Does the patient have difficulty doing errands alone such as visiting a doctor's office or shopping?: No    Assessment & Plan  1. Atherosclerosis of aorta (HCC)  - rosuvastatin (CRESTOR) 5 MG tablet; Take 1 tablet (5 mg total) by mouth daily.  Dispense: 90 tablet; Refill: 1 - Lipid panel  2. Congenital anomaly of fixation of intestine   3. Age-related osteoporosis without current pathological fracture   4. Vitamin D deficiency   5. B12 deficiency   6. Subclinical hypothyroidism  - TSH + free T4  7. Pre-diabetes  - Hemoglobin A1c  8. Pure hypercholesterolemia  - rosuvastatin (CRESTOR) 5 MG tablet; Take 1 tablet (5 mg total) by mouth daily.  Dispense: 90  tablet; Refill: 1 - Lipid panel  9. Long-term use of high-risk medication  - COMPLETE METABOLIC PANEL WITH GFR  10. Hyperglycemia  - Hemoglobin A1c   11. Irregular heart beat  - EKG 12-Lead  12. PVC (premature ventricular contraction)  - Ambulatory referral to Cardiology

## 2021-10-13 ENCOUNTER — Encounter: Payer: Self-pay | Admitting: Family Medicine

## 2021-10-13 ENCOUNTER — Ambulatory Visit: Payer: Medicare PPO | Admitting: Family Medicine

## 2021-10-13 VITALS — BP 124/76 | HR 89 | Resp 16 | Ht 64.0 in | Wt 130.0 lb

## 2021-10-13 DIAGNOSIS — E7800 Pure hypercholesterolemia, unspecified: Secondary | ICD-10-CM | POA: Diagnosis not present

## 2021-10-13 DIAGNOSIS — I700 Atherosclerosis of aorta: Secondary | ICD-10-CM | POA: Diagnosis not present

## 2021-10-13 DIAGNOSIS — E559 Vitamin D deficiency, unspecified: Secondary | ICD-10-CM

## 2021-10-13 DIAGNOSIS — E038 Other specified hypothyroidism: Secondary | ICD-10-CM | POA: Diagnosis not present

## 2021-10-13 DIAGNOSIS — Z79899 Other long term (current) drug therapy: Secondary | ICD-10-CM | POA: Diagnosis not present

## 2021-10-13 DIAGNOSIS — I499 Cardiac arrhythmia, unspecified: Secondary | ICD-10-CM

## 2021-10-13 DIAGNOSIS — R7303 Prediabetes: Secondary | ICD-10-CM | POA: Diagnosis not present

## 2021-10-13 DIAGNOSIS — M810 Age-related osteoporosis without current pathological fracture: Secondary | ICD-10-CM | POA: Diagnosis not present

## 2021-10-13 DIAGNOSIS — E538 Deficiency of other specified B group vitamins: Secondary | ICD-10-CM | POA: Insufficient documentation

## 2021-10-13 DIAGNOSIS — Q433 Congenital malformations of intestinal fixation: Secondary | ICD-10-CM

## 2021-10-13 DIAGNOSIS — I493 Ventricular premature depolarization: Secondary | ICD-10-CM | POA: Insufficient documentation

## 2021-10-13 DIAGNOSIS — R739 Hyperglycemia, unspecified: Secondary | ICD-10-CM

## 2021-10-13 MED ORDER — ROSUVASTATIN CALCIUM 5 MG PO TABS: 5.0000 mg | ORAL_TABLET | Freq: Every day | ORAL | 1 refills | Status: DC

## 2021-10-13 NOTE — Addendum Note (Signed)
Addended by: Dollene Primrose on: 10/13/2021 09:05 AM   Modules accepted: Orders

## 2021-10-13 NOTE — Addendum Note (Signed)
Addended by: Alba Cory F on: 10/13/2021 09:21 AM   Modules accepted: Orders

## 2021-10-21 ENCOUNTER — Encounter: Payer: Self-pay | Admitting: Family Medicine

## 2021-10-22 ENCOUNTER — Other Ambulatory Visit: Payer: Self-pay | Admitting: Family Medicine

## 2021-10-22 DIAGNOSIS — I700 Atherosclerosis of aorta: Secondary | ICD-10-CM

## 2021-10-22 DIAGNOSIS — Z136 Encounter for screening for cardiovascular disorders: Secondary | ICD-10-CM

## 2021-11-03 ENCOUNTER — Ambulatory Visit
Admission: RE | Admit: 2021-11-03 | Discharge: 2021-11-03 | Disposition: A | Discharge status: Home / Self Care | Payer: Medicare PPO | Source: Ambulatory Visit | Attending: Family Medicine | Admitting: Family Medicine

## 2021-11-03 DIAGNOSIS — I700 Atherosclerosis of aorta: Secondary | ICD-10-CM | POA: Insufficient documentation

## 2021-11-03 DIAGNOSIS — Z136 Encounter for screening for cardiovascular disorders: Secondary | ICD-10-CM | POA: Insufficient documentation

## 2021-11-11 DIAGNOSIS — E782 Mixed hyperlipidemia: Secondary | ICD-10-CM | POA: Diagnosis not present

## 2021-11-11 DIAGNOSIS — I447 Left bundle-branch block, unspecified: Secondary | ICD-10-CM | POA: Diagnosis not present

## 2021-11-11 DIAGNOSIS — R9431 Abnormal electrocardiogram [ECG] [EKG]: Secondary | ICD-10-CM | POA: Diagnosis not present

## 2021-11-11 DIAGNOSIS — I493 Ventricular premature depolarization: Secondary | ICD-10-CM | POA: Diagnosis not present

## 2021-12-02 DIAGNOSIS — I447 Left bundle-branch block, unspecified: Secondary | ICD-10-CM | POA: Diagnosis not present

## 2021-12-17 IMAGING — CT CT ABD-PELV W/ CM
2 of 5 series · 16 of 46 positions shown, 18 images · IV contrast (APPLIED)
Comparison: CT abdomen pelvis 02/26/2018

CLINICAL DATA: Patient c/o light, brown liquid stool beginning on
[REDACTED] and sometimes is just mucous. Patient has been recently on 2
antibiotics for dental procedures.

EXAM:
CT ABDOMEN AND PELVIS WITH CONTRAST
TECHNIQUE: Multidetector CT imaging of the abdomen and pelvis was performed
using the standard protocol following bolus administration of
intravenous contrast.
CONTRAST:  75mL OMNIPAQUE IOHEXOL 300 MG/ML  SOLN

[Series 2: routine abd/pel with · axial · 0.69mm/px · z∈[-1077,-687]mm · 13 of 88 slices shown, 15 images]
[im 5/88  soft-tissue]
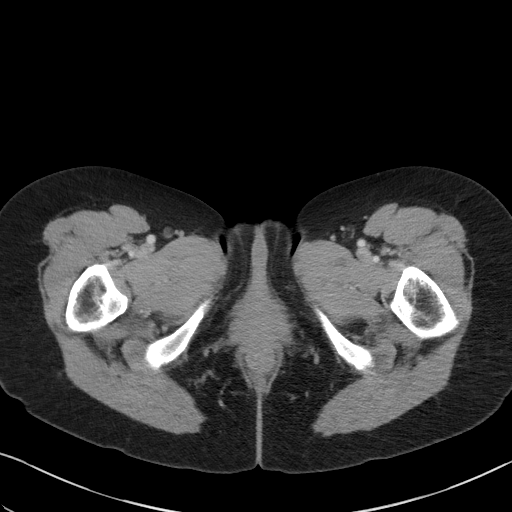
[im 5/88  bone]
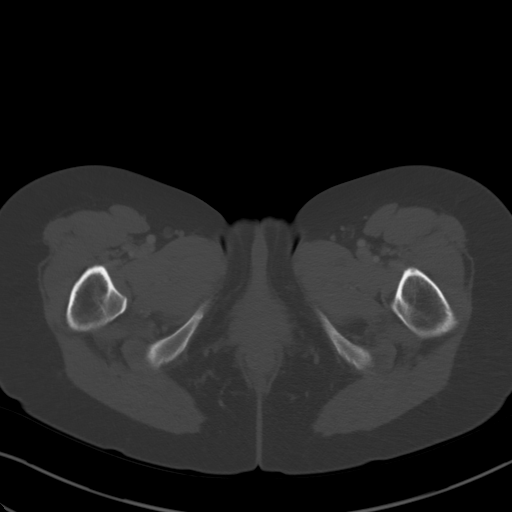
[im 10/88  soft-tissue]
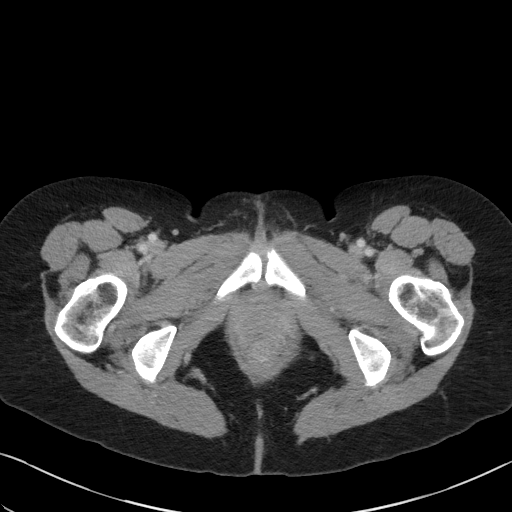
[im 20/88  soft-tissue]
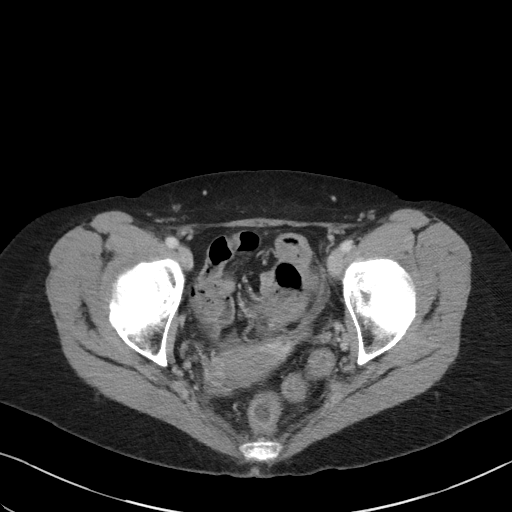
[im 25/88  soft-tissue]
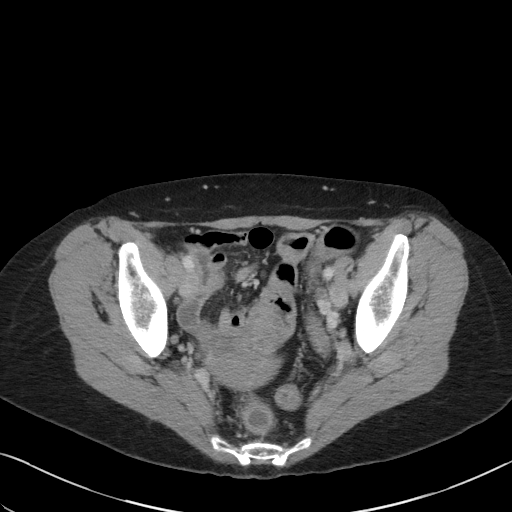
[im 30/88  soft-tissue]
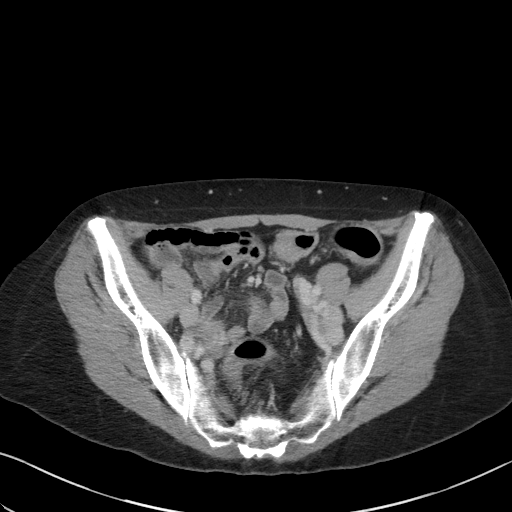
[im 39/88  soft-tissue]
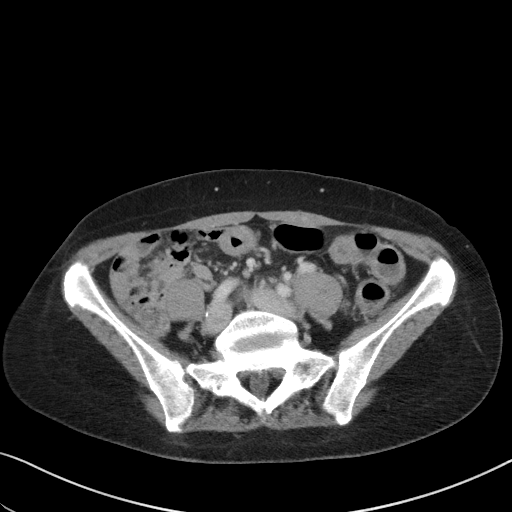
[im 44/88  soft-tissue]
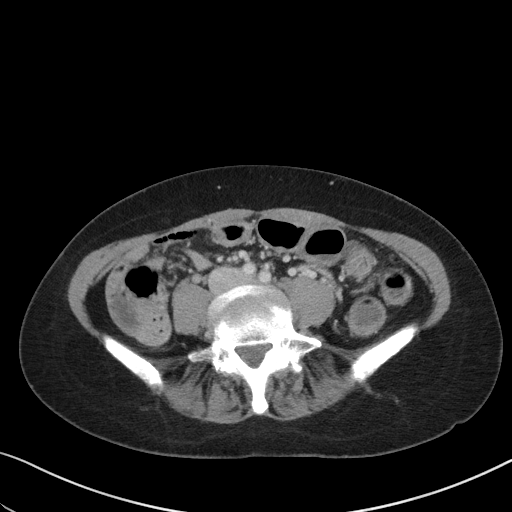
[im 49/88  soft-tissue]
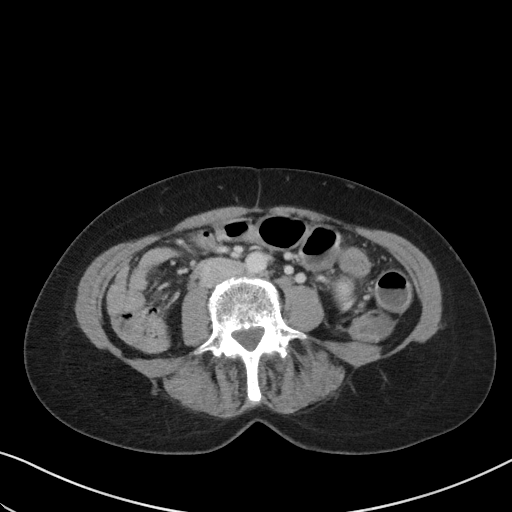
[im 59/88  soft-tissue]
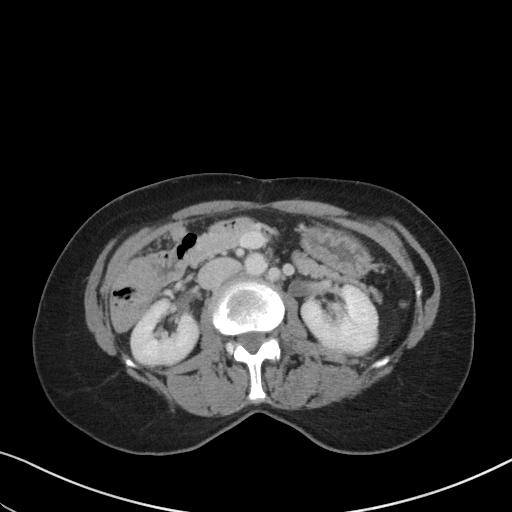
[im 59/88  bone]
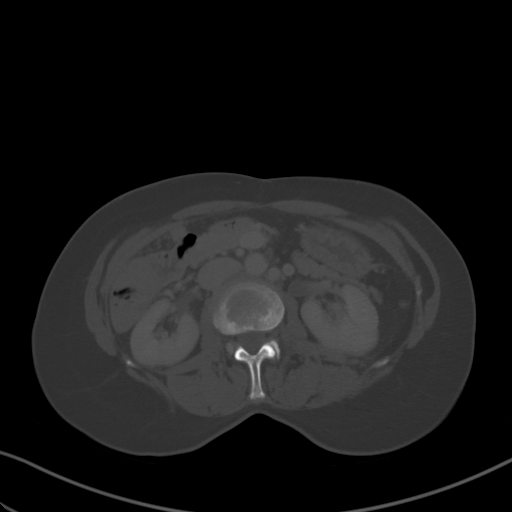
[im 63/88  soft-tissue]
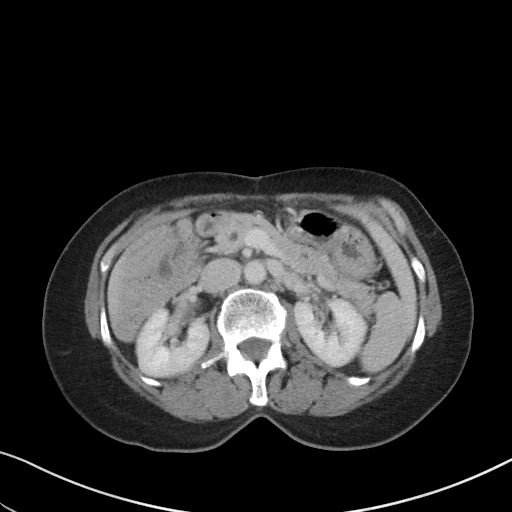
[im 68/88  soft-tissue]
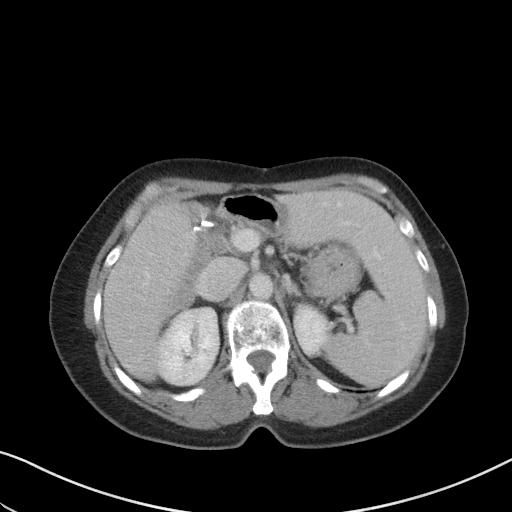
[im 78/88  soft-tissue]
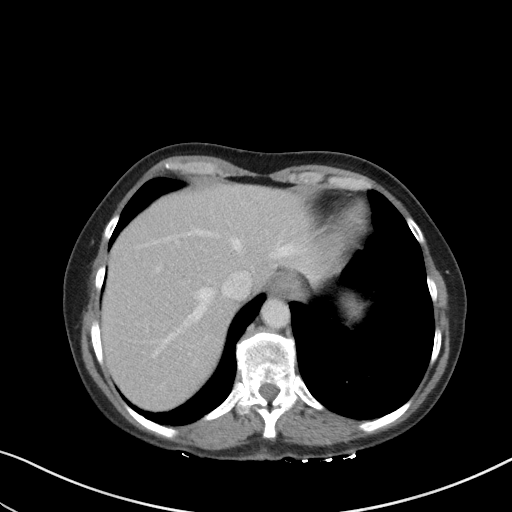
[im 83/88  soft-tissue]
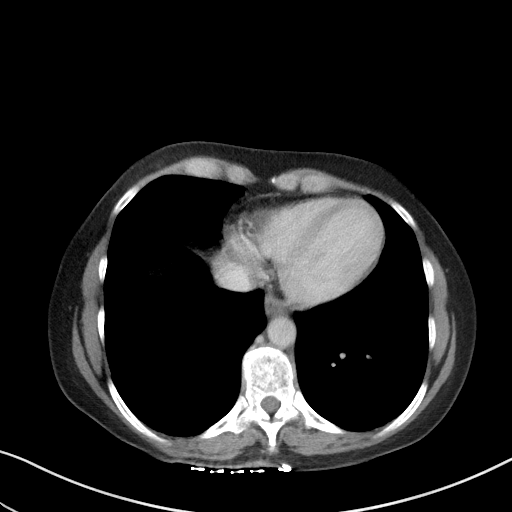

[Series 5: coronal st · coronal · 0.65mm/px · 3 of 74 slices shown]
[im 25/74  soft-tissue]
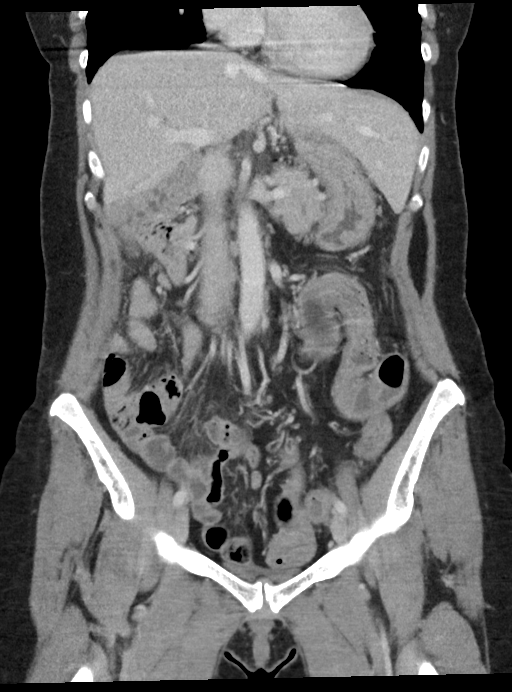
[im 33/74  soft-tissue]
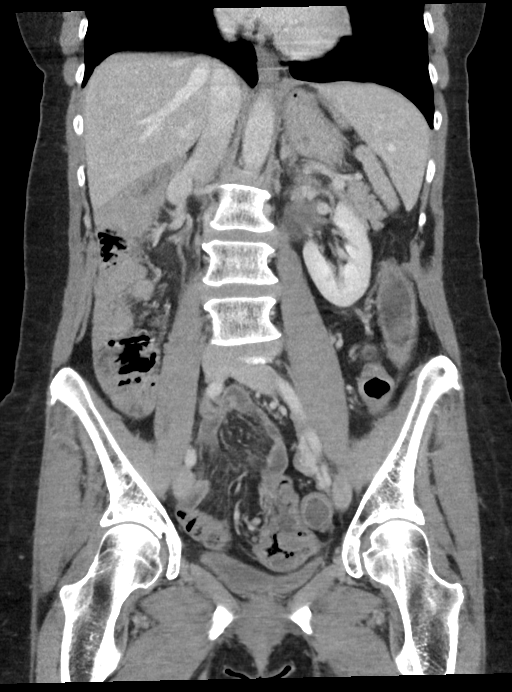
[im 41/74  soft-tissue]
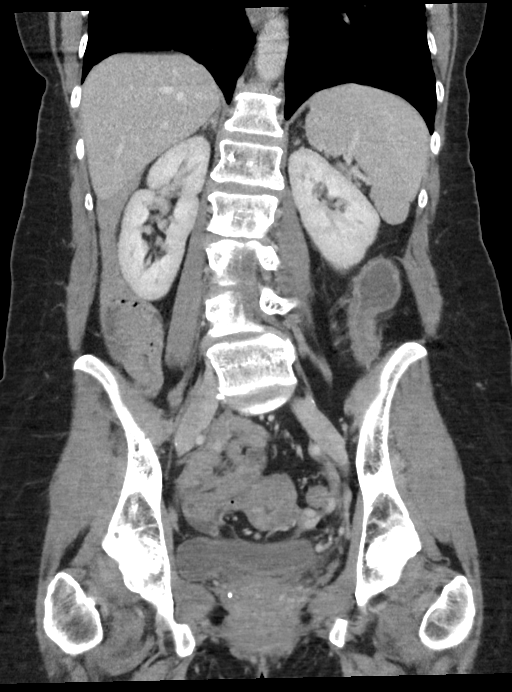

[16 of 46 positions shown; findings below may reference images not displayed]

FINDINGS: Lower chest: No acute abnormality.

Hepatobiliary: Hepatomegaly. The liver extends across the midline.
The previously described hemangioma in the posterior right hepatic
lobe is less conspicuous on today's study. No new focal liver
lesion. The gallbladder is surgically absent. No new biliary duct
dilation.

Pancreas: Unremarkable. No pancreatic ductal dilatation or
surrounding inflammatory changes.

Spleen: Normal in size without focal abnormality.

Adrenals/Urinary Tract: Adrenal glands are unremarkable. Kidneys are
normal, without renal calculi, focal lesion, or hydronephrosis.
Bladder is unremarkable.

Stomach/Bowel: Stomach is unremarkable. There is chronic bowel
malrotation with the colon in the a central and left abdomen. There
is diffuse bowel wall thickening and enhancement throughout the
colon and rectum. No dilated loops of bowel to suggest obstruction.
No free air.

Vascular/Lymphatic: No significant vascular findings are present. No
enlarged abdominal or pelvic lymph nodes.

Reproductive: Uterus and bilateral adnexa are unremarkable.

Other: Trace free fluid in the pelvis. No abdominal wall
abnormality.

Musculoskeletal: No acute or significant osseous findings. Few
scattered bone islands.
IMPRESSION: 1. There is diffuse bowel wall thickening and enhancement throughout
the colon and rectum, consistent with colitis, which may be
infectious or inflammatory in etiology.
2. Chronic bowel malrotation.

## 2021-12-23 DIAGNOSIS — E782 Mixed hyperlipidemia: Secondary | ICD-10-CM | POA: Diagnosis not present

## 2021-12-23 DIAGNOSIS — I447 Left bundle-branch block, unspecified: Secondary | ICD-10-CM | POA: Diagnosis not present

## 2021-12-23 DIAGNOSIS — R9431 Abnormal electrocardiogram [ECG] [EKG]: Secondary | ICD-10-CM | POA: Diagnosis not present

## 2021-12-23 DIAGNOSIS — I428 Other cardiomyopathies: Secondary | ICD-10-CM | POA: Diagnosis not present

## 2022-01-10 DIAGNOSIS — M2021 Hallux rigidus, right foot: Secondary | ICD-10-CM | POA: Diagnosis not present

## 2022-03-09 DIAGNOSIS — Z1231 Encounter for screening mammogram for malignant neoplasm of breast: Secondary | ICD-10-CM | POA: Diagnosis not present

## 2022-03-30 DIAGNOSIS — K5902 Outlet dysfunction constipation: Secondary | ICD-10-CM | POA: Diagnosis not present

## 2022-03-30 DIAGNOSIS — K5904 Chronic idiopathic constipation: Secondary | ICD-10-CM | POA: Diagnosis not present

## 2022-03-30 DIAGNOSIS — R1010 Upper abdominal pain, unspecified: Secondary | ICD-10-CM | POA: Diagnosis not present

## 2022-03-30 DIAGNOSIS — K648 Other hemorrhoids: Secondary | ICD-10-CM | POA: Diagnosis not present

## 2022-03-30 DIAGNOSIS — K219 Gastro-esophageal reflux disease without esophagitis: Secondary | ICD-10-CM | POA: Diagnosis not present

## 2022-04-06 DIAGNOSIS — Z6822 Body mass index (BMI) 22.0-22.9, adult: Secondary | ICD-10-CM | POA: Diagnosis not present

## 2022-04-06 DIAGNOSIS — M810 Age-related osteoporosis without current pathological fracture: Secondary | ICD-10-CM | POA: Diagnosis not present

## 2022-04-06 DIAGNOSIS — Z01419 Encounter for gynecological examination (general) (routine) without abnormal findings: Secondary | ICD-10-CM | POA: Diagnosis not present

## 2022-04-06 DIAGNOSIS — N952 Postmenopausal atrophic vaginitis: Secondary | ICD-10-CM | POA: Diagnosis not present

## 2022-04-14 DIAGNOSIS — M810 Age-related osteoporosis without current pathological fracture: Secondary | ICD-10-CM | POA: Diagnosis not present

## 2022-04-14 DIAGNOSIS — Z8781 Personal history of (healed) traumatic fracture: Secondary | ICD-10-CM | POA: Diagnosis not present

## 2022-04-14 NOTE — Progress Notes (Signed)
Name: Dana Arnold   MRN: [id number]    DOB: December 05, 1950   Date:04/15/2022       Progress Note  Subjective  Chief Complaint  Follow Up  HPI  Subclinical hypothyroidism: last TSH was 6.5, last time we checked down to 4 51 . She denies  dry skin, she has a long history of dysphagia due to esophageal stenosis   Non ischemic cardiomyopathy/negative calcium score: she was seen by Dr. Nehemiah Massed Echo showed moderate LV systolic dysfunction at EF of 30 % she also has LBBB, she is not taking any medications. Discussed going back for a follow up, her bp has been controlled at home but may benefit from low dose ARB/ACE or even SGL-2 agonist She does not have any symptoms or SOB, orthopnea or lower extremity edema  Intestinal malformation: found on CT of abdomen for evaluation of abdominal pain at [place] [facility]  , also found to have fatty liver.She has diarrhea intermittently and is followed by GI    Low Vitamin D level last level was normal, taking supplementation , discussed high calcium diet again , we will recheck labs   B12 insufficieny: she has been taking SL supplementation three times a week. We will recheck labs   Pre-diabetes : she has changed her diet , avoiding sweets, states no recent hypoglycemic episodes last A1C was down from 5.7 % to 5.4 %  Denies polyphagia, polydipsia or polyuria. .  Dyslipidemia: last LDL was 141 but she has atherosclerosis of aorta - seen on CT abdomen pelvis. I sent Crestor but patient never took it since cardiac calcium score was zero, explained that she still has plaque and should take it   Chronic constipation: taking miralax, had pelvic PT and is doing better, sees GI  Osteoporosis: right femur was -2.5 %, she is not taking calcium she is on vitamin D , discussed options of therapy, oral, injectables , she was seen by Endo again this week and is thinking about Reclast   Constipation : seeing GI at [facility] and diagnosed with pelvic floor dyssynergia and is going to start PT  next month, she is also taking Miralax and has helped with her bowel movement    Patient Active Problem List   Diagnosis Date Noted   PVC (premature ventricular contraction) 10/13/2021   Pre-diabetes 10/13/2021   B12 deficiency 10/13/2021   Pure hypercholesterolemia 10/13/2021   Fatty liver 06/17/2021   Atherosclerosis of aorta ([place]) 06/17/2021   Constipation due to outlet dysfunction 06/09/2021   Dyssynergic defecation 06/09/2021   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   Gastro-esophageal reflux disease without esophagitis 04/14/2021   Tortuous colon 04/14/2021   Osteoporosis 03/31/2021   History of fracture of left hip 10/14/2020   Osteoarthritis 10/22/2019   Chronic neck pain 04/05/2019   Osteoporosis, post-menopausal 01/03/2019   Scoliosis of lumbar spine 04/22/2016   Thoracic scoliosis 09/08/2015   Chondromalacia 03/10/2015   Hyperglycemia 12/24/2014   Cervical radiculitis 12/24/2014   Subclinical hypothyroidism 12/24/2014   Vitamin D deficiency 12/24/2014   Hx of Clostridium difficile infection 12/24/2014   External hemorrhoid 12/24/2014   Chronic gastric erosion 12/24/2014   Chondrocalcinosis of right knee 12/24/2014   DDD (degenerative disc disease), lumbar 12/24/2014    Past Surgical History:  Procedure Laterality Date   arthroscopic knee Right 05/26/2014   CHOLECYSTECTOMY  02/28/1989   DILATION AND CURETTAGE OF UTERUS     HIP PINNING,CANNULATED Left 08/07/2020   Procedure: Left hip percutaneous pinning;  Surgeon: Leim Fabry,  MD;  Location: [facility] ORS;  Service: Orthopedics;  Laterality: Left;   right ulna surgery Right 07/01/2010   TONSILLECTOMY AND ADENOIDECTOMY Bilateral 03/01/1959    Family History  Problem Relation Age of Onset   Osteoporosis Mother    Aneurysm Father     Social History   Tobacco Use   Smoking status: Never   Smokeless tobacco: Never  Substance Use Topics   Alcohol use: No    Alcohol/week: 0.0 standard drinks of  alcohol     Current Outpatient Medications:    blood glucose meter kit and supplies, Dispense based on patient and insurance preference. Use up to four times daily as directed. Hypoglycemia episodes, Disp: 1 each, Rfl: 0   Cholecalciferol (VITAMIN D) 2000 units tablet, Take 3,000 Units by mouth daily., Disp: , Rfl:    cyanocobalamin 1000 MCG tablet, Take 1,000 mcg by mouth 3 (three) times a week., Disp: , Rfl:    Magnesium 200 MG TABS, Take 400 mg by mouth daily., Disp: , Rfl:    Misc Natural Products (TURMERIC CURCUMIN) CAPS, Take 2 capsules by mouth daily., Disp: , Rfl:    rosuvastatin (CRESTOR) 5 MG tablet, Take 1 tablet (5 mg total) by mouth daily. (Patient not taking: Reported on 04/15/2022), Disp: 90 tablet, Rfl: 1  Allergies  Allergen Reactions   Terazol 3 [Terconazole] Swelling and Rash   Macrobid [Nitrofurantoin Macrocrystal]     I personally reviewed active problem list, medication list, allergies, family history, social history, health maintenance with the patient/caregiver today.   ROS  Constitutional: Negative for fever or weight change.  Respiratory: Negative for cough and shortness of breath.   Cardiovascular: Negative for chest pain or palpitations.  Gastrointestinal: Negative for abdominal pain, no bowel changes.  Musculoskeletal: Negative for gait problem or joint swelling.  Skin: Negative for rash.  Neurological: Negative for dizziness or headache.  No other specific complaints in a complete review of systems (except as listed in HPI above).   Objective  Vitals:   04/15/22 0812  BP: 124/70  Pulse: 87  Resp: 16  SpO2: 99%  Weight: 132 lb (59.9 kg)  Height: 5' 4"$  (1.626 m)    Body mass index is 22.66 kg/m.  Physical Exam  Constitutional: Patient appears well-developed and well-nourished.  No distress.  HEENT: head atraumatic, normocephalic, pupils equal and reactive to light, neck supple Cardiovascular: Normal rate, regular rhythm and normal heart  sounds.  No murmur heard. No BLE edema. Pulmonary/Chest: Effort normal and breath sounds normal. No respiratory distress. Abdominal: Soft.  There is no tenderness. Psychiatric: Patient has a normal mood and affect. behavior is normal. Judgment and thought content normal.   PHQ2/9:    04/15/2022    8:12 AM 10/13/2021    8:16 AM 07/08/2021   10:52 AM 04/14/2021    8:59 AM 10/14/2020    8:52 AM  Depression screen PHQ 2/9  Decreased Interest 0 0 0 0 0  Down, Depressed, Hopeless 0 0 0 0 0  PHQ - 2 Score 0 0 0 0 0  Altered sleeping 0 0     Tired, decreased energy 0 0     Change in appetite 0 0     Feeling bad or failure about yourself  0 0     Trouble concentrating 0 0     Moving slowly or fidgety/restless 0 0     Suicidal thoughts 0 0     PHQ-9 Score 0 0       phq  9 is negative   Fall Risk:    04/15/2022    8:12 AM 10/13/2021    8:16 AM 07/08/2021   10:53 AM 04/14/2021    9:02 AM 04/14/2021    8:59 AM  Fall Risk   Falls in the past year? 0 0 1 1 0  Number falls in past yr: 0 0 0 0 0  Injury with Fall? 0 0 1 0 0  Risk for fall due to : No Fall Risks No Fall Risks History of fall(s)  No Fall Risks  Follow up Falls prevention discussed Falls prevention discussed Falls prevention discussed Falls prevention discussed;Falls evaluation completed Falls prevention discussed      Functional Status Survey: Is the patient deaf or have difficulty hearing?: No Does the patient have difficulty seeing, even when wearing glasses/contacts?: No Does the patient have difficulty concentrating, remembering, or making decisions?: No Does the patient have difficulty walking or climbing stairs?: No Does the patient have difficulty dressing or bathing?: No Does the patient have difficulty doing errands alone such as visiting a doctor's office or shopping?: No    Assessment & Plan  1. Atherosclerosis of aorta (HCC)  - Lipid panel Consider starting statin therapy   2. Non-ischemic cardiomyopathy  ([place])  Discussed going back to cardiologist to discuss medications?   3. Need for pneumococcal 20-valent conjugate vaccination  - Pneumococcal conjugate vaccine 20-valent (Prevnar 20)  4. Age-related osteoporosis without current pathological fracture  - VITAMIN D 25 Hydroxy (Vit-D Deficiency, Fractures)  5. Vitamin D deficiency  - VITAMIN D 25 Hydroxy (Vit-D Deficiency, Fractures)  6. B12 deficiency  - B12 and Folate Panel  7. Pre-diabetes   8. Subclinical hypothyroidism  - TSH + free T4  9. Congenital anomaly of fixation of intestine   10. LBBB (left bundle branch block)   11. Dysphagia, unspecified type  Seeing GI  12. Long-term use of high-risk medication  - COMPLETE METABOLIC PANEL WITH GFR - CBC with Differential/Platelet  13. Hyperglycemia  - Hemoglobin A1c

## 2022-04-15 ENCOUNTER — Ambulatory Visit: Payer: Medicare PPO | Admitting: Family Medicine

## 2022-04-15 ENCOUNTER — Encounter: Payer: Self-pay | Admitting: Family Medicine

## 2022-04-15 VITALS — BP 124/70 | HR 87 | Resp 16 | Ht 64.0 in | Wt 132.0 lb

## 2022-04-15 DIAGNOSIS — R7303 Prediabetes: Secondary | ICD-10-CM | POA: Diagnosis not present

## 2022-04-15 DIAGNOSIS — E538 Deficiency of other specified B group vitamins: Secondary | ICD-10-CM

## 2022-04-15 DIAGNOSIS — E559 Vitamin D deficiency, unspecified: Secondary | ICD-10-CM | POA: Diagnosis not present

## 2022-04-15 DIAGNOSIS — I700 Atherosclerosis of aorta: Secondary | ICD-10-CM

## 2022-04-15 DIAGNOSIS — M810 Age-related osteoporosis without current pathological fracture: Secondary | ICD-10-CM | POA: Diagnosis not present

## 2022-04-15 DIAGNOSIS — R1310 Dysphagia, unspecified: Secondary | ICD-10-CM

## 2022-04-15 DIAGNOSIS — I447 Left bundle-branch block, unspecified: Secondary | ICD-10-CM | POA: Diagnosis not present

## 2022-04-15 DIAGNOSIS — Z79899 Other long term (current) drug therapy: Secondary | ICD-10-CM

## 2022-04-15 DIAGNOSIS — Q433 Congenital malformations of intestinal fixation: Secondary | ICD-10-CM

## 2022-04-15 DIAGNOSIS — I428 Other cardiomyopathies: Secondary | ICD-10-CM

## 2022-04-15 DIAGNOSIS — R739 Hyperglycemia, unspecified: Secondary | ICD-10-CM | POA: Diagnosis not present

## 2022-04-15 DIAGNOSIS — Z23 Encounter for immunization: Secondary | ICD-10-CM

## 2022-04-15 DIAGNOSIS — E038 Other specified hypothyroidism: Secondary | ICD-10-CM | POA: Diagnosis not present

## 2022-04-15 NOTE — Patient Instructions (Signed)
Cardiomyopathy, Adult  Cardiomyopathy is a disease of the heart muscle. The disease makes the heart muscle thick, weak, or stiff. As a result, the heart works harder to pump blood. Over time, cardiomyopathy can lead to an irregular heartbeat (arrhythmia) and a condition in which the heart has trouble pumping blood throughout the body (heart failure). There are several types of cardiomyopathy. The kind of cardiomyopathy that you have depends on how the heart is affected and which part of the heart is affected. What are the causes? This condition may be caused by: A medical condition that damages the heart, such as diabetes, high blood pressure, heart attack, or a viral infection. Diseases of the immune system, connective tissues, or endocrine system. Too much iron in the body (hemochromatosis). Cancer treatments. Environmental exposure to toxins such as lead or mercury. A leaking or tight heart valve. Other causes may include: Heavy use of alcohol or drugs, or taking certain medicines. Pregnancy. Extreme stress. In some cases, the cause is not known. What increases the risk? You are more likely to develop this condition if: You have a gene that is passed down (inherited) from a family member. You are overweight or obese. What are the signs or symptoms? Often, people with this condition have no symptoms. If you do have symptoms, they may include: Shortness of breath, especially during activity. Tiredness (fatigue). An arrhythmia. Feeling dizzy or light-headed, or fainting. Chest pain. Coughing. Swelling of the legs, feet, or abdomen. How is this diagnosed? This condition is diagnosed based on: Your symptoms and medical history. A physical exam. Blood tests. A chest X-ray. Other tests, such as: An echocardiogram, which is an ultrasound of the heart. A cardiac stress test. Cardiac catheterization and coronary angiogram. Heart tissue biopsy. An MRI of the heart. A portable heart  monitor that records your heart's electrical activity (event monitor). An electrocardiogram (ECG or EKG). How is this treated? Your treatment depends on the type and severity of your symptoms. Treatment may include: Healthy lifestyle changes. Medicines to: Treat high blood pressure, abnormal heart rate, or inflammation. Strengthen your heart muscle. Remove excess fluids from your body. Balance minerals (electrolytes) in your body and get rid of extra sodium in your body. Prevent blood clots. Surgery to: Repair a heart defect, remove heart tissue, or destroy tissues in the area of abnormal electrical activity (ablation). Implant a device to treat serious heart rhythm problems such as a pacemaker or a defibrillator. Implant a pressure monitoring system to monitor your fluid balance. Fix or replace a bad heart valve in your heart. Replace your heart with a healthy heart. This is done if all other treatments have failed. Other treatments may include cardiac resynchronization therapy (CRT). This involves a type of pacemaker or defibrillator that restores the heart's normal beating pattern and helps strengthen the heart's ability to contract. Follow these instructions at home: Medicines Take over-the-counter and prescription medicines only as told by your health care provider. Some medicines can be dangerous for your heart. If you are prescribed a blood thinner, make sure you understand what to do in a bleeding situation. Lifestyle  Eat a heart-healthy diet that includes plenty of fruits, vegetables, whole grains, and foods that are low in salt (sodium). Maintain a healthy weight. Stay physically active. Ask your health care provider what activities are safe for you. Do not use any products that contain nicotine or tobacco. These products include cigarettes, chewing tobacco, and vaping devices, such as e-cigarettes. If you need help quitting, ask your  health care provider. Try to get at least 7  hours of sleep each night. Find healthy ways to manage stress. Weigh yourself every day in the morning. Alcohol use Do not drink alcohol if: Your health care provider tells you not to drink. You are pregnant, may be pregnant, or are planning to become pregnant. If you drink alcohol: Limit how much you have to: 0-1 drink a day for women. 0-2 drinks a day for men. Know how much alcohol is in your drink. In the [place] one drink equals one 12 oz bottle of beer (355 mL), one 5 oz glass of wine (148 mL), or one 1 oz glass of hard liquor (44 mL). General instructions Ask your health care provider if you should wear a medical identification bracelet. This may be important if you have a pacemaker or a defibrillator. Get all needed vaccines. Get a flu shot every year. Work closely with your health care provider to manage any other chronic conditions. If you plan to start a family, talk with a genetic counselor to discuss the risk of having a child with cardiomyopathy. Keep all follow-up visits. This is important, even if you do not have any symptoms. Your health care provider may need to make sure your condition is not getting worse. Where to find more information American Heart Association: www.heart.[place]; [employer], Lung, and [place]: https://wilson-eaton.com/ Contact a health care provider if: You gain 2-3 lb (1-1.4 kg) overnight or 4-5 lb (1.8-2.3 kg) in a week. You have new symptoms. Your symptoms get worse. Get help right away if: You have severe chest pain. You have shortness of breath. You cough up a pink, bubbly substance. You feel nauseous and you vomit. You suddenly become light-headed or dizzy. You feel your heart beating very quickly. You feel like your heart is skipping beats. These symptoms may represent a serious problem that is an emergency. Do not wait to see if the symptoms will go away. Get medical help right away. Call your local emergency  services (911 in the [place] Do not drive yourself to the hospital. Summary Cardiomyopathy is a disease of the heart muscle. Over time, cardiomyopathy can lead to an irregular heartbeat (arrhythmia) and heart failure. A number of treatments are available for this condition. Your treatment will depend on the type and severity of your symptoms. Follow your health care provider's instructions on diet, medicines, physical activity, and when to seek help. This information is not intended to replace advice given to you by your health care provider. Make sure you discuss any questions you have with your health care provider. Document Revised: 06/14/2020 Document Reviewed: 06/14/2020 Elsevier Patient Education  [place].

## 2022-04-16 LAB — COMPLETE METABOLIC PANEL WITH GFR
AG Ratio: 1.6 (calc) (ref 1.0–2.5)
ALT: 15 U/L (ref 6–29)
AST: 22 U/L (ref 10–35)
Albumin: 4.1 g/dL (ref 3.6–5.1)
Alkaline phosphatase (APISO): 61 U/L (ref 37–153)
BUN: 14 mg/dL (ref 7–25)
CO2: 28 mmol/L (ref 20–32)
Calcium: 9.9 mg/dL (ref 8.6–10.4)
Chloride: 105 mmol/L (ref 98–110)
Creat: 0.67 mg/dL (ref 0.50–1.05)
Globulin: 2.6 g/dL (calc) (ref 1.9–3.7)
Glucose, Bld: 85 mg/dL (ref 65–99)
Potassium: 5 mmol/L (ref 3.5–5.3)
Sodium: 144 mmol/L (ref 135–146)
Total Bilirubin: 0.4 mg/dL (ref 0.2–1.2)
Total Protein: 6.7 g/dL (ref 6.1–8.1)
eGFR: 95 mL/min/{1.73_m2} (ref >=60)

## 2022-04-16 LAB — CBC WITH DIFFERENTIAL/PLATELET
Absolute Monocytes: 340 cells/uL (ref 200–950)
Basophils Absolute: 41 cells/uL (ref 0–200)
Basophils Relative: 0.9 %
Eosinophils Absolute: 69 cells/uL (ref 15–500)
Eosinophils Relative: 1.5 %
HCT: 41.3 % (ref 35.0–45.0)
Hemoglobin: 13.5 g/dL (ref 11.7–15.5)
Lymphs Abs: 2038 cells/uL (ref 850–3900)
MCH: 30.2 pg (ref 27.0–33.0)
MCHC: 32.7 g/dL (ref 32.0–36.0)
MCV: 92.4 fL (ref 80.0–100.0)
MPV: 11.4 fL (ref 7.5–12.5)
Monocytes Relative: 7.4 %
Neutro Abs: 2111 cells/uL (ref 1500–7800)
Neutrophils Relative %: 45.9 %
Platelets: 228 10*3/uL (ref 140–400)
RBC: 4.47 10*6/uL (ref 3.80–5.10)
RDW: 11.7 % (ref 11.0–15.0)
Total Lymphocyte: 44.3 %
WBC: 4.6 10*3/uL (ref 3.8–10.8)

## 2022-04-16 LAB — B12 AND FOLATE PANEL
Folate: 17 ng/mL
Vitamin B-12: 489 pg/mL (ref 200–1100)

## 2022-04-16 LAB — LIPID PANEL
Cholesterol: 220 mg/dL — ABNORMAL HIGH (ref <200)
HDL: 84 mg/dL (ref >=50)
LDL Cholesterol (Calc): 120 mg/dL (calc) — ABNORMAL HIGH
Non-HDL Cholesterol (Calc): 136 mg/dL (calc) — ABNORMAL HIGH (ref <130)
Total CHOL/HDL Ratio: 2.6 (calc) (ref <5.0)
Triglycerides: 65 mg/dL (ref <150)

## 2022-04-16 LAB — HEMOGLOBIN A1C
Hgb A1c MFr Bld: 5.9 % of total Hgb — ABNORMAL HIGH (ref <5.7)
Mean Plasma Glucose: 123 mg/dL
eAG (mmol/L): 6.8 mmol/L

## 2022-04-16 LAB — VITAMIN D 25 HYDROXY (VIT D DEFICIENCY, FRACTURES): Vit D, 25-Hydroxy: 63 ng/mL (ref 30–100)

## 2022-04-16 LAB — T4, FREE: Free T4: 1 ng/dL (ref 0.8–1.8)

## 2022-04-16 LAB — TSH+FREE T4: TSH W/REFLEX TO FT4: 5.78 mIU/L — ABNORMAL HIGH (ref 0.40–4.50)

## 2022-04-28 ENCOUNTER — Telehealth: Payer: Self-pay | Admitting: Family Medicine

## 2022-04-28 NOTE — Telephone Encounter (Signed)
Contacted Dana Arnold to schedule their annual wellness visit. Appointment made for 07/14/2022.  [place] Direct Dial: [phone]

## 2022-05-16 DIAGNOSIS — I428 Other cardiomyopathies: Secondary | ICD-10-CM | POA: Diagnosis not present

## 2022-05-16 DIAGNOSIS — D3132 Benign neoplasm of left choroid: Secondary | ICD-10-CM | POA: Diagnosis not present

## 2022-05-16 DIAGNOSIS — I447 Left bundle-branch block, unspecified: Secondary | ICD-10-CM | POA: Diagnosis not present

## 2022-05-26 NOTE — Progress Notes (Signed)
Name: Dana Arnold   MRN: [id number]    DOB: 02-21-1951   Date:05/27/2022       Progress Note  Subjective  Chief Complaint  Jaw Pain  HPI  Tooth Abscess: she developed pain and swelling on right side of jaw, she was seen by Dr. Aleda Grana and diagnosed with an abscess on tooth #28 and was given Keflex to take four times a day. She has a history of C. Difi colitis ( after taking clindamycin back in 2021 )  and also history of itching hands with Augmentin in the past . She just started Keflex last night, she is also taking ibuprofen. . She continue to have pain and wants to be examined due to right side sore throat   Concentrated urine: it happened yesterday, no odor, dysuria or fever associated with it, advised to stay hydrated   Patient Active Problem List   Diagnosis Date Noted   Long-term use of high-risk medication 04/15/2022   LBBB (left bundle branch block) 04/15/2022   PVC (premature ventricular contraction) 10/13/2021   Pre-diabetes 10/13/2021   B12 deficiency 10/13/2021   Pure hypercholesterolemia 10/13/2021   Fatty liver 06/17/2021   Atherosclerosis of aorta ([place]) 06/17/2021   Constipation due to outlet dysfunction 06/09/2021   Dyssynergic defecation 06/09/2021   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   Gastro-esophageal reflux disease without esophagitis 04/14/2021   Tortuous colon 04/14/2021   Osteoporosis 03/31/2021   History of fracture of left hip 10/14/2020   Osteoarthritis 10/22/2019   Chronic neck pain 04/05/2019   Osteoporosis, post-menopausal 01/03/2019   Scoliosis of lumbar spine 04/22/2016   Thoracic scoliosis 09/08/2015   Chondromalacia 03/10/2015   Hyperglycemia 12/24/2014   Cervical radiculitis 12/24/2014   Subclinical hypothyroidism 12/24/2014   Vitamin D deficiency 12/24/2014   Hx of Clostridium difficile infection 12/24/2014   External hemorrhoid 12/24/2014   Chronic gastric erosion 12/24/2014   Chondrocalcinosis of right knee  12/24/2014   DDD (degenerative disc disease), lumbar 12/24/2014    Past Surgical History:  Procedure Laterality Date   arthroscopic knee Right 05/26/2014   CHOLECYSTECTOMY  02/28/1989   DILATION AND CURETTAGE OF UTERUS     HIP PINNING,CANNULATED Left 08/07/2020   Procedure: Left hip percutaneous pinning;  Surgeon: Leim Fabry, MD;  Location: [facility] ORS;  Service: Orthopedics;  Laterality: Left;   right ulna surgery Right 07/01/2010   TONSILLECTOMY AND ADENOIDECTOMY Bilateral 03/01/1959    Family History  Problem Relation Age of Onset   Osteoporosis Mother    Aneurysm Father     Social History   Tobacco Use   Smoking status: Never   Smokeless tobacco: Never  Substance Use Topics   Alcohol use: No    Alcohol/week: 0.0 standard drinks of alcohol     Current Outpatient Medications:    blood glucose meter kit and supplies, Dispense based on patient and insurance preference. Use up to four times daily as directed. Hypoglycemia episodes, Disp: 1 each, Rfl: 0   cephALEXin (KEFLEX) 500 MG capsule, Take by mouth., Disp: , Rfl:    Cholecalciferol (VITAMIN D) 2000 units tablet, Take 3,000 Units by mouth daily., Disp: , Rfl:    cyanocobalamin 1000 MCG tablet, Take 1,000 mcg by mouth 3 (three) times a week., Disp: , Rfl:    Magnesium 200 MG TABS, Take 400 mg by mouth daily., Disp: , Rfl:    Misc Natural Products (TURMERIC CURCUMIN) CAPS, Take 2 capsules by mouth daily., Disp: , Rfl:   Allergies  Allergen Reactions  Terazol 3 [Terconazole] Swelling and Rash   Augmentin [Amoxicillin-Pot Clavulanate] Itching    Hands    Macrobid [Nitrofurantoin Macrocrystal]     I personally reviewed active problem list, medication list, allergies, family history, social history, health maintenance with the patient/caregiver today.   ROS  Ten systems reviewed and is negative except as mentioned in HPI   Objective  Vitals:   05/27/22 1445  BP: 132/72  Pulse: 92  Resp: 14  Temp: 97.7 F  (36.5 C)  TempSrc: Oral  SpO2: 98%  Weight: 131 lb 11.2 oz (59.7 kg)  Height: 5' 4.5" (1.638 m)    Body mass index is 22.26 kg/m.  Physical Exam  Constitutional: Patient appears well-developed and well-nourished. No distress.  HEENT: head atraumatic, normocephalic, pupils equal and reactive to light, neck supple, throat within normal limits, tender during palpation of lower jaw, no fluctuation in her gum.  Cardiovascular: Normal rate, regular rhythm and normal heart sounds.  No murmur heard. No BLE edema. Pulmonary/Chest: Effort normal and breath sounds normal. No respiratory distress. Abdominal: Soft.  There is no tenderness. Psychiatric: Patient has a normal mood and affect. behavior is normal. Judgment and thought content normal.    PHQ2/9:    05/27/2022    2:38 PM 04/15/2022    8:12 AM 10/13/2021    8:16 AM 07/08/2021   10:52 AM 04/14/2021    8:59 AM  Depression screen PHQ 2/9  Decreased Interest 0 0 0 0 0  Down, Depressed, Hopeless 0 0 0 0 0  PHQ - 2 Score 0 0 0 0 0  Altered sleeping 0 0 0    Tired, decreased energy 0 0 0    Change in appetite 0 0 0    Feeling bad or failure about yourself  0 0 0    Trouble concentrating 0 0 0    Moving slowly or fidgety/restless 0 0 0    Suicidal thoughts 0 0 0    PHQ-9 Score 0 0 0      phq 9 is negative   Fall Risk:    05/27/2022    2:38 PM 04/15/2022    8:12 AM 10/13/2021    8:16 AM 07/08/2021   10:53 AM 04/14/2021    9:02 AM  Fall Risk   Falls in the past year? 0 0 0 1 1  Number falls in past yr:  0 0 0 0  Injury with Fall?  0 0 1 0  Risk for fall due to : No Fall Risks No Fall Risks No Fall Risks History of fall(s)   Follow up Falls prevention discussed Falls prevention discussed Falls prevention discussed Falls prevention discussed Falls prevention discussed;Falls evaluation completed      Functional Status Survey: Is the patient deaf or have difficulty hearing?: No Does the patient have difficulty seeing, even when  wearing glasses/contacts?: No Does the patient have difficulty concentrating, remembering, or making decisions?: No Does the patient have difficulty walking or climbing stairs?: No Does the patient have difficulty dressing or bathing?: No Does the patient have difficulty doing errands alone such as visiting a doctor's office or shopping?: No    Assessment & Plan  1. Tooth abscess  Needs to resume nsaid's and take tylenol for pain, antibiotics will take 48 hours to start working

## 2022-05-27 ENCOUNTER — Ambulatory Visit: Payer: Medicare PPO | Admitting: Family Medicine

## 2022-05-27 ENCOUNTER — Encounter: Payer: Self-pay | Admitting: Family Medicine

## 2022-05-27 VITALS — BP 132/72 | HR 92 | Temp 97.7°F | Resp 14 | Ht 64.5 in | Wt 131.7 lb

## 2022-05-27 DIAGNOSIS — K047 Periapical abscess without sinus: Secondary | ICD-10-CM

## 2022-06-08 ENCOUNTER — Other Ambulatory Visit (HOSPITAL_COMMUNITY): Payer: Self-pay | Admitting: Internal Medicine

## 2022-06-08 DIAGNOSIS — I447 Left bundle-branch block, unspecified: Secondary | ICD-10-CM

## 2022-06-08 DIAGNOSIS — I428 Other cardiomyopathies: Secondary | ICD-10-CM

## 2022-06-16 ENCOUNTER — Other Ambulatory Visit (HOSPITAL_COMMUNITY): Payer: Self-pay | Admitting: *Deleted

## 2022-06-16 ENCOUNTER — Encounter (HOSPITAL_COMMUNITY): Payer: Self-pay

## 2022-06-16 DIAGNOSIS — I447 Left bundle-branch block, unspecified: Secondary | ICD-10-CM

## 2022-06-16 DIAGNOSIS — Z01810 Encounter for preprocedural cardiovascular examination: Secondary | ICD-10-CM

## 2022-06-20 ENCOUNTER — Telehealth (HOSPITAL_COMMUNITY): Payer: Self-pay | Admitting: *Deleted

## 2022-06-20 NOTE — Telephone Encounter (Signed)
Patient returning call about her upcoming cardiac imaging study; pt verbalizes understanding of appt date/time, parking situation and where to check in; name and call back number provided for further questions should they arise  Larey Brick RN Navigator Cardiac Imaging [facility] [phone] office [phone] cell  Patient only reports pins in her hip and other orthopedic surgeries but states it was titanium. Patient denies metal and is aware to avoid caffeine 12 hours prior.

## 2022-06-20 NOTE — Telephone Encounter (Signed)
Attempted to call patient regarding upcoming cardiac Stress MRI appointment. Left message on voicemail with name and callback number  Larey Brick RN Navigator Cardiac Imaging [facility] [phone] Office [phone] Cell  Reminder for patient to avoid caffeine and chocolate 12 hours prior to appointment.

## 2022-06-21 ENCOUNTER — Other Ambulatory Visit (HOSPITAL_COMMUNITY): Payer: Self-pay | Admitting: Internal Medicine

## 2022-06-21 ENCOUNTER — Ambulatory Visit (HOSPITAL_COMMUNITY)
Admission: RE | Admit: 2022-06-21 | Discharge: 2022-06-21 | Disposition: A | Discharge status: Home / Self Care | Payer: Medicare PPO | Source: Ambulatory Visit | Attending: Family Medicine | Admitting: Family Medicine

## 2022-06-21 ENCOUNTER — Ambulatory Visit (HOSPITAL_COMMUNITY)
Admission: RE | Admit: 2022-06-21 | Discharge: 2022-06-21 | Disposition: A | Discharge status: Home / Self Care | Payer: Medicare PPO | Source: Ambulatory Visit | Attending: Internal Medicine | Admitting: Internal Medicine

## 2022-06-21 ENCOUNTER — Encounter: Payer: Self-pay | Admitting: Cardiology

## 2022-06-21 DIAGNOSIS — I447 Left bundle-branch block, unspecified: Secondary | ICD-10-CM | POA: Insufficient documentation

## 2022-06-21 DIAGNOSIS — I428 Other cardiomyopathies: Secondary | ICD-10-CM

## 2022-06-21 DIAGNOSIS — Z01810 Encounter for preprocedural cardiovascular examination: Secondary | ICD-10-CM | POA: Diagnosis not present

## 2022-06-21 MED ORDER — REGADENOSON 0.4 MG/5ML IV SOLN
0.4000 mg | Freq: Once | INTRAVENOUS | Status: AC
  Administered 2022-06-21: 0.4 mg via INTRAVENOUS
  Filled 2022-06-21: qty 5

## 2022-06-21 MED ORDER — REGADENOSON 0.4 MG/5ML IV SOLN
INTRAVENOUS | Status: AC
  Filled 2022-06-21: qty 5

## 2022-06-21 MED ORDER — GADOBUTROL 1 MMOL/ML IV SOLN
10.0000 mL | Freq: Once | INTRAVENOUS | Status: AC | PRN
  Administered 2022-06-21: 10 mL via INTRAVENOUS

## 2022-06-21 NOTE — Progress Notes (Signed)
EKG CRITICAL VALUE     12 lead EKG performed.  Critical value noted., Dr Gasper Lloyd notified.   Orma Flaming,  06/21/2022 9:35 AM

## 2022-06-21 NOTE — Progress Notes (Signed)
Patient presents for stress MRI.  BP 124/64, HR 97.  No caffeine intake in prior 12 hours. No wheezing on exam.  EKG today shows sinus rhythm with PACs, LBBB   Shared Decision Making/Informed Consent The risks [chest pain, shortness of breath, cardiac arrhythmias, dizziness, blood pressure fluctuations, myocardial infarction, stroke/transient ischemic attack, nausea, vomiting, allergic reaction, and life-threatening complications (estimated to be 1 in 10,000)], benefits (risk stratification, diagnosing coronary artery disease, treatment guidance) and alternatives of a MRI stress test were discussed in detail with patient and they agree to proceed.

## 2022-07-05 DIAGNOSIS — M79605 Pain in left leg: Secondary | ICD-10-CM | POA: Diagnosis not present

## 2022-07-05 DIAGNOSIS — M545 Low back pain, unspecified: Secondary | ICD-10-CM | POA: Diagnosis not present

## 2022-07-11 DIAGNOSIS — I447 Left bundle-branch block, unspecified: Secondary | ICD-10-CM | POA: Diagnosis not present

## 2022-07-11 DIAGNOSIS — E782 Mixed hyperlipidemia: Secondary | ICD-10-CM | POA: Diagnosis not present

## 2022-07-11 DIAGNOSIS — I700 Atherosclerosis of aorta: Secondary | ICD-10-CM | POA: Diagnosis not present

## 2022-07-11 DIAGNOSIS — I428 Other cardiomyopathies: Secondary | ICD-10-CM | POA: Diagnosis not present

## 2022-07-12 DIAGNOSIS — M545 Low back pain, unspecified: Secondary | ICD-10-CM | POA: Diagnosis not present

## 2022-07-12 DIAGNOSIS — M79605 Pain in left leg: Secondary | ICD-10-CM | POA: Diagnosis not present

## 2022-07-14 ENCOUNTER — Ambulatory Visit: Payer: Medicare PPO

## 2022-07-14 DIAGNOSIS — M79605 Pain in left leg: Secondary | ICD-10-CM | POA: Diagnosis not present

## 2022-07-14 DIAGNOSIS — M545 Low back pain, unspecified: Secondary | ICD-10-CM | POA: Diagnosis not present

## 2022-07-19 DIAGNOSIS — M545 Low back pain, unspecified: Secondary | ICD-10-CM | POA: Diagnosis not present

## 2022-07-19 DIAGNOSIS — M79605 Pain in left leg: Secondary | ICD-10-CM | POA: Diagnosis not present

## 2022-07-26 DIAGNOSIS — M545 Low back pain, unspecified: Secondary | ICD-10-CM | POA: Diagnosis not present

## 2022-07-26 DIAGNOSIS — M79605 Pain in left leg: Secondary | ICD-10-CM | POA: Diagnosis not present

## 2022-07-28 DIAGNOSIS — M79605 Pain in left leg: Secondary | ICD-10-CM | POA: Diagnosis not present

## 2022-07-28 DIAGNOSIS — M545 Low back pain, unspecified: Secondary | ICD-10-CM | POA: Diagnosis not present

## 2022-08-02 DIAGNOSIS — M545 Low back pain, unspecified: Secondary | ICD-10-CM | POA: Diagnosis not present

## 2022-08-02 DIAGNOSIS — M79605 Pain in left leg: Secondary | ICD-10-CM | POA: Diagnosis not present

## 2022-08-04 DIAGNOSIS — Z0000 Encounter for general adult medical examination without abnormal findings: Secondary | ICD-10-CM

## 2022-08-04 NOTE — Progress Notes (Signed)
I connected with  Dana Arnold on 08/04/22 by a audio enabled telemedicine application and verified that I am speaking with the correct person using two identifiers.  Patient Location: Home  Provider Location: Office/Clinic  I discussed the limitations of evaluation and management by telemedicine. The patient expressed understanding and agreed to proceed.  Subjective:   Dana Arnold is a 73 y.o. female who presents for Medicare Annual (Subsequent) preventive examination.  Review of Systems         Objective:    There were no vitals filed for this visit. There is no height or weight on file to calculate BMI.     07/08/2021   10:52 AM 06/14/2021    8:16 PM 08/06/2020   10:22 AM 05/05/2019   11:32 AM 08/02/2016    8:38 AM 04/22/2016   11:02 AM 05/14/2015   10:17 AM  Advanced Directives  Does Patient Have a Medical Advance Directive? Yes No No No No No No  Type of Estate agent of [place];Living will        Copy of Healthcare Power of Attorney in Chart? No - copy requested        Would patient like information on creating a medical advance directive?   No - Patient declined No - Patient declined       Current Medications (verified) Outpatient Encounter Medications as of 08/04/2022  Medication Sig   blood glucose meter kit and supplies Dispense based on patient and insurance preference. Use up to four times daily as directed. Hypoglycemia episodes   cephALEXin (KEFLEX) 500 MG capsule Take by mouth.   Cholecalciferol (VITAMIN D) 2000 units tablet Take 3,000 Units by mouth daily.   cyanocobalamin 1000 MCG tablet Take 1,000 mcg by mouth 3 (three) times a week.   Magnesium 200 MG TABS Take 400 mg by mouth daily.   Misc Natural Products (TURMERIC CURCUMIN) CAPS Take 2 capsules by mouth daily.   No facility-administered encounter medications on file as of 08/04/2022.    Allergies (verified) Terazol 3 [terconazole], Augmentin [amoxicillin-pot clavulanate], and Macrobid  [nitrofurantoin macrocrystal]   History: Past Medical History:  Diagnosis Date   Cervical radiculitis    Cervicalgia    Chondromalacia    Chronic constipation    Degeneration of lumbar or lumbosacral intervertebral disc    External hemorrhoid, thrombosed    Gastritis, chronic    GERD (gastroesophageal reflux disease)    Hyperglycemia    Hyperlipidemia    Lupus anticoagulant positive    Olecranon bursitis    Osteopenia    Ovarian failure    Paresthesia    Vitamin D deficiency    Past Surgical History:  Procedure Laterality Date   arthroscopic knee Right 05/26/2014   CHOLECYSTECTOMY  02/28/1989   DILATION AND CURETTAGE OF UTERUS     HIP PINNING,CANNULATED Left 08/07/2020   Procedure: Left hip percutaneous pinning;  Surgeon: Signa Kell, MD;  Location: [facility] ORS;  Service: Orthopedics;  Laterality: Left;   right ulna surgery Right 07/01/2010   TONSILLECTOMY AND ADENOIDECTOMY Bilateral 03/01/1959   Family History  Problem Relation Age of Onset   Osteoporosis Mother    Aneurysm Father    Social History   Socioeconomic History   Marital status: Married    Spouse name: Dana Arnold    Number of children: 3   Years of education: Not on file   Highest education level: Associate degree: occupational, Scientist, product/process development, or vocational program  Occupational History   Occupation:  Education officer, environmental  Comment: [employer]   Tobacco Use   Smoking status: Never   Smokeless tobacco: Never  Vaping Use   Vaping Use: Never used  Substance and Sexual Activity   Alcohol use: No    Alcohol/week: 0.0 standard drinks of alcohol   Drug use: No   Sexual activity: Yes    Partners: Male    Birth control/protection: Post-menopausal  Other Topics Concern   Not on file  Social History Narrative   Not on file   Social Determinants of Health   Financial Resource Strain: Low Risk  (07/08/2021)   Overall Financial Resource Strain (CARDIA)    Difficulty of Paying Living Expenses: Not hard at all   Food Insecurity: No Food Insecurity (07/08/2021)   Hunger Vital Sign    Worried About Running Out of Food in the Last Year: Never true    Ran Out of Food in the Last Year: Never true  Transportation Needs: No Transportation Needs (07/08/2021)   PRAPARE - Administrator, Civil Service (Medical): No    Lack of Transportation (Non-Medical): No  Physical Activity: Sufficiently Active (07/08/2021)   Exercise Vital Sign    Days of Exercise per Week: 3 days    Minutes of Exercise per Session: 50 min  Stress: No Stress Concern Present (07/08/2021)   [employer] of Occupational Health - Occupational Stress Questionnaire    Feeling of Stress : Not at all  Social Connections: Socially Integrated (07/08/2021)   Social Connection and Isolation Panel [NHANES]    Frequency of Communication with Friends and Family: More than three times a week    Frequency of Social Gatherings with Friends and Family: More than three times a week    Attends Religious Services: More than 4 times per year    Active Member of [employer] or Organizations: Yes    Attends Engineer, structural: More than 4 times per year    Marital Status: Married    Tobacco Counseling Counseling given: Not Answered   Clinical Intake:                 Diabetic?         Activities of Daily Living    05/27/2022    2:39 PM 04/15/2022    8:13 AM  In your present state of health, do you have any difficulty performing the following activities:  Hearing? 0 0  Vision? 0 0  Difficulty concentrating or making decisions? 0 0  Walking or climbing stairs? 0 0  Dressing or bathing? 0 0  Doing errands, shopping? 0 0    Patient Care Team: Alba Cory, MD as PCP - General (Family Medicine) Debbrah Alar, MD (Dermatology) Brown Human, MD (Gastroenterology) Harold Hedge, MD as Consulting Physician (Obstetrics and Gynecology) Tedd Sias Marlana Salvage, MD as Physician Assistant (Endocrinology)  Indicate  any recent Medical Services you may have received from other than [facility] providers in the past year (date may be approximate).     Assessment:   This is a routine wellness examination for Dana Arnold.  Hearing/Vision screen No results found.  Dietary issues and exercise activities discussed:     Goals Addressed   None    Depression Screen    05/27/2022    2:38 PM 04/15/2022    8:12 AM 10/13/2021    8:16 AM 07/08/2021   10:52 AM 04/14/2021    8:59 AM 10/14/2020    8:52 AM 04/21/2020    9:01 AM  PHQ 2/9 Scores  PHQ -  2 Score 0 0 0 0 0 0 0  PHQ- 9 Score 0 0 0        Fall Risk    05/27/2022    2:38 PM 04/15/2022    8:12 AM 10/13/2021    8:16 AM 07/08/2021   10:53 AM 04/14/2021    9:02 AM  Fall Risk   Falls in the past year? 0 0 0 1 1  Number falls in past yr:  0 0 0 0  Injury with Fall?  0 0 1 0  Risk for fall due to : No Fall Risks No Fall Risks No Fall Risks History of fall(s)   Follow up Falls prevention discussed Falls prevention discussed Falls prevention discussed Falls prevention discussed Falls prevention discussed;Falls evaluation completed               Screening Tests Health Maintenance  Topic Date Due   COVID-19 Vaccine (5 - 2023-24 season) 10/29/2021   Medicare Annual Wellness (AWV)  07/09/2022   INFLUENZA VACCINE  09/29/2022   MAMMOGRAM  03/04/2023   DTaP/Tdap/Td (3 - Td or Tdap) 12/23/2024   Colonoscopy  12/16/2027   Pneumonia Vaccine 44+ Years old  Completed   DEXA SCAN  Completed   Hepatitis C Screening  Completed   Zoster Vaccines- Shingrix  Completed   HPV VACCINES  Aged Out    Health Maintenance  Health Maintenance Due  Topic Date Due   COVID-19 Vaccine (5 - 2023-24 season) 10/29/2021   Medicare Annual Wellness (AWV)  07/09/2022          Lung Cancer Screening: (Low Dose CT Chest recommended if Age 4-80 years, 30 pack-year currently smoking OR have quit w/in 15years.)  qualify.   Lung Cancer Screening Referral:   Additional  Screening:  Hepatitis C Screening:  qualify; Completed   Vision Screening: Recommended annual ophthalmology exams for early detection of glaucoma and other disorders of the eye. Is the patient up to date with their annual eye exam?   Who is the provider or what is the name of the office in which the patient attends annual eye exams?  If pt is not established with a provider, would they like to be referred to a provider to establish care? .   Dental Screening: Recommended annual dental exams for proper oral hygiene  Community Resource Referral / Chronic Care Management: CRR required this visit?    CCM required this visit?       Plan:     I have personally reviewed and noted the following in the patient's chart:   Medical and social history Use of alcohol, tobacco or illicit drugs  Current medications and supplements including opioid prescriptions.  Functional ability and status Nutritional status Physical activity Advanced directives List of other physicians Hospitalizations, surgeries, and ER visits in previous 12 months Vitals Screenings to include cognitive, depression, and falls Referrals and appointments  In addition, I have reviewed and discussed with patient certain preventive protocols, quality metrics, and best practice recommendations. A written personalized care plan for preventive services as well as general preventive health recommendations were provided to patient.     Sue Lush, LPN   03/05/1094   Nurse Notes:

## 2022-08-05 DIAGNOSIS — M79605 Pain in left leg: Secondary | ICD-10-CM | POA: Diagnosis not present

## 2022-08-05 DIAGNOSIS — M545 Low back pain, unspecified: Secondary | ICD-10-CM | POA: Diagnosis not present

## 2022-08-08 DIAGNOSIS — M79605 Pain in left leg: Secondary | ICD-10-CM | POA: Diagnosis not present

## 2022-08-08 DIAGNOSIS — M545 Low back pain, unspecified: Secondary | ICD-10-CM | POA: Diagnosis not present

## 2022-08-09 DIAGNOSIS — K219 Gastro-esophageal reflux disease without esophagitis: Secondary | ICD-10-CM | POA: Diagnosis not present

## 2022-08-09 DIAGNOSIS — E785 Hyperlipidemia, unspecified: Secondary | ICD-10-CM | POA: Diagnosis not present

## 2022-08-09 DIAGNOSIS — Z9049 Acquired absence of other specified parts of digestive tract: Secondary | ICD-10-CM | POA: Diagnosis not present

## 2022-08-09 DIAGNOSIS — R1310 Dysphagia, unspecified: Secondary | ICD-10-CM | POA: Diagnosis not present

## 2022-08-11 DIAGNOSIS — M79605 Pain in left leg: Secondary | ICD-10-CM | POA: Diagnosis not present

## 2022-08-11 DIAGNOSIS — M545 Low back pain, unspecified: Secondary | ICD-10-CM | POA: Diagnosis not present

## 2022-08-12 ENCOUNTER — Ambulatory Visit (INDEPENDENT_AMBULATORY_CARE_PROVIDER_SITE_OTHER): Payer: Medicare PPO

## 2022-08-12 VITALS — Ht 64.5 in | Wt 131.0 lb

## 2022-08-12 DIAGNOSIS — Z0000 Encounter for general adult medical examination without abnormal findings: Secondary | ICD-10-CM | POA: Diagnosis not present

## 2022-08-12 NOTE — Progress Notes (Signed)
I connected with  Westley Foots on 08/12/22 by a audio enabled telemedicine application and verified that I am speaking with the correct person using two identifiers.  Patient Location: Home  Provider Location: Office/Clinic  I discussed the limitations of evaluation and management by telemedicine. The patient expressed understanding and agreed to proceed.  Subjective:   Dana Arnold is a 72 y.o. female who presents for Medicare Annual (Subsequent) preventive examination.  Review of Systems    Cardiac Risk Factors include: advanced age (>53men, >78 women)    Objective:    Today's Vitals   08/12/22 1046  Weight: 131 lb (59.4 kg)  Height: 5' 4.5" (1.638 m)   Body mass index is 22.14 kg/m.     08/12/2022   11:00 AM 07/08/2021   10:52 AM 06/14/2021    8:16 PM 08/06/2020   10:22 AM 05/05/2019   11:32 AM 08/02/2016    8:38 AM 04/22/2016   11:02 AM  Advanced Directives  Does Patient Have a Medical Advance Directive? No Yes No No No No No  Type of Furniture conservator/restorer;Living will       Copy of Healthcare Power of Attorney in Chart?  No - copy requested       Would patient like information on creating a medical advance directive?    No - Patient declined No - Patient declined      Current Medications (verified) Outpatient Encounter Medications as of 08/12/2022  Medication Sig   blood glucose meter kit and supplies Dispense based on patient and insurance preference. Use up to four times daily as directed. Hypoglycemia episodes   cephALEXin (KEFLEX) 500 MG capsule Take by mouth. (Patient not taking: Reported on 08/12/2022)   Cholecalciferol (VITAMIN D) 2000 units tablet Take 3,000 Units by mouth daily.   cyanocobalamin 1000 MCG tablet Take 1,000 mcg by mouth 3 (three) times a week.   Magnesium 200 MG TABS Take 400 mg by mouth daily.   Misc Natural Products (TURMERIC CURCUMIN) CAPS Take 2 capsules by mouth daily.   No facility-administered encounter medications  on file as of 08/12/2022.    Allergies (verified) Terazol 3 [terconazole], Augmentin [amoxicillin-pot clavulanate], and Macrobid [nitrofurantoin macrocrystal]   History: Past Medical History:  Diagnosis Date   Cervical radiculitis    Cervicalgia    Chondromalacia    Chronic constipation    Degeneration of lumbar or lumbosacral intervertebral disc    External hemorrhoid, thrombosed    Gastritis, chronic    GERD (gastroesophageal reflux disease)    Hyperglycemia    Hyperlipidemia    Lupus anticoagulant positive    Olecranon bursitis    Osteopenia    Ovarian failure    Paresthesia    Vitamin D deficiency    Past Surgical History:  Procedure Laterality Date   arthroscopic knee Right 05/26/2014   CHOLECYSTECTOMY  02/28/1989   DILATION AND CURETTAGE OF UTERUS     HIP PINNING,CANNULATED Left 08/07/2020   Procedure: Left hip percutaneous pinning;  Surgeon: Signa Kell, MD;  Location: [facility] ORS;  Service: Orthopedics;  Laterality: Left;   right ulna surgery Right 07/01/2010   TONSILLECTOMY AND ADENOIDECTOMY Bilateral 03/01/1959   Family History  Problem Relation Age of Onset   Osteoporosis Mother    Aneurysm Father    Social History   Socioeconomic History   Marital status: Married    Spouse name: Renae Fickle    Number of children: 3   Years of education: Not on file  Highest education level: Associate degree: occupational, Scientist, product/process development, or vocational program  Occupational History   Occupation:  teacher assistance     Comment: [employer]   Tobacco Use   Smoking status: Never   Smokeless tobacco: Never  Vaping Use   Vaping Use: Never used  Substance and Sexual Activity   Alcohol use: No    Alcohol/week: 0.0 standard drinks of alcohol   Drug use: No   Sexual activity: Yes    Partners: Male    Birth control/protection: Post-menopausal  Other Topics Concern   Not on file  Social History Narrative   Not on file   Social Determinants of Health   Financial Resource  Strain: Low Risk  (08/12/2022)   Overall Financial Resource Strain (CARDIA)    Difficulty of Paying Living Expenses: Not hard at all  Food Insecurity: No Food Insecurity (08/12/2022)   Hunger Vital Sign    Worried About Running Out of Food in the Last Year: Never true    Ran Out of Food in the Last Year: Never true  Transportation Needs: No Transportation Needs (08/12/2022)   PRAPARE - Administrator, Civil Service (Medical): No    Lack of Transportation (Non-Medical): No  Physical Activity: Sufficiently Active (08/12/2022)   Exercise Vital Sign    Days of Exercise per Week: 3 days    Minutes of Exercise per Session: 50 min  Stress: No Stress Concern Present (08/12/2022)   [employer] of Occupational Health - Occupational Stress Questionnaire    Feeling of Stress : Not at all  Social Connections: Socially Integrated (08/12/2022)   Social Connection and Isolation Panel [NHANES]    Frequency of Communication with Friends and Family: Three times a week    Frequency of Social Gatherings with Friends and Family: Three times a week    Attends Religious Services: More than 4 times per year    Active Member of Clubs or Organizations: Yes    Attends Engineer, structural: More than 4 times per year    Marital Status: Married    Tobacco Counseling Counseling given: Not Answered   Clinical Intake:  Pre-visit preparation completed: Yes  Pain : No/denies pain     BMI - recorded: 22.14 Nutritional Status: BMI of 19-24  Normal Nutritional Risks: None Diabetes: No  How often do you need to have someone help you when you read instructions, pamphlets, or other written materials from your doctor or pharmacy?: 1 - Never  Diabetic?no  Interpreter Needed?: No  Comments: lives with husband Information entered by :: B.Makyna Niehoff,LPN   Activities of Daily Living    08/12/2022   11:10 AM 05/27/2022    2:39 PM  In your present state of health, do you have any  difficulty performing the following activities:  Hearing? 0 0  Vision? 0 0  Difficulty concentrating or making decisions? 0 0  Walking or climbing stairs? 0 0  Dressing or bathing? 0 0  Doing errands, shopping? 0 0  Preparing Food and eating ? N   Using the Toilet? N   In the past six months, have you accidently leaked urine? N   Do you have problems with loss of bowel control? N   Managing your Medications? N   Managing your Finances? N   Housekeeping or managing your Housekeeping? N     Patient Care Team: Alba Cory, MD as PCP - General (Family Medicine) Debbrah Alar, MD (Dermatology) Brown Human, MD (Gastroenterology) Harold Hedge, MD as  Consulting Physician (Obstetrics and Gynecology) Tedd Sias Marlana Salvage, MD as Physician Assistant (Endocrinology)  Indicate any recent Medical Services you may have received from other than [facility] providers in the past year (date may be approximate).     Assessment:   This is a routine wellness examination for Dana Arnold.  Hearing/Vision screen Hearing Screening - Comments:: Adequate hearing Vision Screening - Comments:: Adequate vision;readers only Dr Dellie Burns  Dietary issues and exercise activities discussed: Current Exercise Habits: Home exercise routine, Type of exercise: walking, Time (Minutes): 50, Frequency (Times/Week): 3, Weekly Exercise (Minutes/Week): 150, Intensity: Mild, Exercise limited by: neurologic condition(s);orthopedic condition(s);cardiac condition(s)   Goals Addressed             This Visit's Progress    Increase physical activity   On track    Pt would like to walk 2 miles at least 3 days per week        Depression Screen    08/12/2022   10:50 AM 05/27/2022    2:38 PM 04/15/2022    8:12 AM 10/13/2021    8:16 AM 07/08/2021   10:52 AM 04/14/2021    8:59 AM 10/14/2020    8:52 AM  PHQ 2/9 Scores  PHQ - 2 Score 0 0 0 0 0 0 0  PHQ- 9 Score  0 0 0       Fall Risk    08/12/2022   10:48 AM 05/27/2022     2:38 PM 04/15/2022    8:12 AM 10/13/2021    8:16 AM 07/08/2021   10:53 AM  Fall Risk   Falls in the past year? 0 0 0 0 1  Number falls in past yr: 0  0 0 0  Injury with Fall? 0  0 0 1  Risk for fall due to : No Fall Risks No Fall Risks No Fall Risks No Fall Risks History of fall(s)  Follow up Education provided;Falls prevention discussed Falls prevention discussed Falls prevention discussed Falls prevention discussed Falls prevention discussed    FALL RISK PREVENTION PERTAINING TO THE HOME:  Any stairs in or around the home? No  If so, are there any without handrails? No  Home free of loose throw rugs in walkways, pet beds, electrical cords, etc? Yes  Adequate lighting in your home to reduce risk of falls? Yes   ASSISTIVE DEVICES UTILIZED TO PREVENT FALLS:  Life alert? No  Use of a cane, walker or w/c? No  Grab bars in the bathroom? Yes  Shower chair or bench in shower? Yes  Elevated toilet seat or a handicapped toilet? Yes   Cognitive Function:        08/12/2022   11:12 AM  6CIT Screen  What Year? 0 points  What month? 0 points  What time? 0 points  Count back from 20 0 points  Months in reverse 0 points  Repeat phrase 0 points  Total Score 0 points    Immunizations Immunization History  Administered Date(s) Administered   Influenza, High Dose Seasonal PF 12/21/2018   Influenza,inj,Quad PF,6+ Mos 11/14/2013   Influenza-Unspecified 12/16/2014   PFIZER Comirnaty(Gray Top)Covid-19 Tri-Sucrose Vaccine 03/19/2019, 04/11/2019   PFIZER(Purple Top)SARS-COV-2 Vaccination 12/18/2019   PNEUMOCOCCAL CONJUGATE-20 04/15/2022   Pfizer Covid-19 Vaccine Bivalent Booster 64yrs & up 02/04/2021   Td 05/21/2009   Tdap 12/24/2014   Zoster Recombinat (Shingrix) 08/02/2018, 11/27/2018   Zoster, Live 05/22/2015    TDAP status: Up to date  Flu Vaccine status: Declined, Education has been provided regarding the importance of this  vaccine but patient still declined. Advised may  receive this vaccine at local pharmacy or Health Dept. Aware to provide a copy of the vaccination record if obtained from local pharmacy or Health Dept. Verbalized acceptance and understanding.  Pneumococcal vaccine status: Up to date  Covid-19 vaccine status: Completed vaccines  Qualifies for Shingles Vaccine? Yes   Zostavax completed Yes   Shingrix Completed?: Yes  Screening Tests Health Maintenance  Topic Date Due   COVID-19 Vaccine (5 - 2023-24 season) 10/29/2021   INFLUENZA VACCINE  09/29/2022   MAMMOGRAM  03/04/2023   Medicare Annual Wellness (AWV)  08/12/2023   DTaP/Tdap/Td (3 - Td or Tdap) 12/23/2024   Colonoscopy  12/16/2027   Pneumonia Vaccine 50+ Years old  Completed   DEXA SCAN  Completed   Hepatitis C Screening  Completed   Zoster Vaccines- Shingrix  Completed   HPV VACCINES  Aged Out    Health Maintenance  Health Maintenance Due  Topic Date Due   COVID-19 Vaccine (5 - 2023-24 season) 10/29/2021    Colorectal cancer screening: Type of screening: Colonoscopy. Completed yes. Repeat every 10 years  Mammogram status: Completed yes. Repeat every year  Bone Density status: Completed yes. Results reflect: Bone density results: OSTEOPOROSIS. Repeat every 3 years.  Lung Cancer Screening: (Low Dose CT Chest recommended if Age 54-80 years, 30 pack-year currently smoking OR have quit w/in 15years.) does not qualify.   Lung Cancer Screening Referral: no  Additional Screening:  Hepatitis C Screening: does not qualify; Completed yes  Vision Screening: Recommended annual ophthalmology exams for early detection of glaucoma and other disorders of the eye. Is the patient up to date with their annual eye exam?  Yes  Who is the provider or what is the name of the office in which the patient attends annual eye exams? Dr Dellie Burns If pt is not established with a provider, would they like to be referred to a provider to establish care? No .   Dental Screening: Recommended  annual dental exams for proper oral hygiene  Community Resource Referral / Chronic Care Management: CRR required this visit?  No   CCM required this visit?  No      Plan:     I have personally reviewed and noted the following in the patient's chart:   Medical and social history Use of alcohol, tobacco or illicit drugs  Current medications and supplements including opioid prescriptions. Patient is not currently taking opioid prescriptions. Functional ability and status Nutritional status Physical activity Advanced directives List of other physicians Hospitalizations, surgeries, and ER visits in previous 12 months Vitals Screenings to include cognitive, depression, and falls Referrals and appointments  In addition, I have reviewed and discussed with patient certain preventive protocols, quality metrics, and best practice recommendations. A written personalized care plan for preventive services as well as general preventive health recommendations were provided to patient.     Sue Lush, LPN   1/61/0960   Nurse Notes: The patient states she is doing well and has no concerns or questions at this time.

## 2022-08-12 NOTE — Patient Instructions (Signed)
Dana Arnold , Thank you for taking time to come for your Medicare Wellness Visit. I appreciate your ongoing commitment to your health goals. Please review the following plan we discussed and let me know if I can assist you in the future.   These are the goals we discussed:  Goals      Increase physical activity     Pt would like to walk 2 miles at least 3 days per week         This is a list of the screening recommended for you and due dates:  Health Maintenance  Topic Date Due   COVID-19 Vaccine (5 - 2023-24 season) 10/29/2021   Flu Shot  09/29/2022   Mammogram  03/04/2023   Medicare Annual Wellness Visit  08/12/2023   DTaP/Tdap/Td vaccine (3 - Td or Tdap) 12/23/2024   Colon Cancer Screening  12/16/2027   Pneumonia Vaccine  Completed   DEXA scan (bone density measurement)  Completed   Hepatitis C Screening  Completed   Zoster (Shingles) Vaccine  Completed   HPV Vaccine  Aged Out    Advanced directives: no  Conditions/risks identified: none  Next appointment: Follow up in one year for your annual wellness visit 08/18/2023 @9 :45am telephone   Preventive Care 65 Years and Older, Female Preventive care refers to lifestyle choices and visits with your health care provider that can promote health and wellness. What does preventive care include? A yearly physical exam. This is also called an annual well check. Dental exams once or twice a year. Routine eye exams. Ask your health care provider how often you should have your eyes checked. Personal lifestyle choices, including: Daily care of your teeth and gums. Regular physical activity. Eating a healthy diet. Avoiding tobacco and drug use. Limiting alcohol use. Practicing safe sex. Taking low-dose aspirin every day. Taking vitamin and mineral supplements as recommended by your health care provider. What happens during an annual well check? The services and screenings done by your health care provider during your annual well  check will depend on your age, overall health, lifestyle risk factors, and family history of disease. Counseling  Your health care provider may ask you questions about your: Alcohol use. Tobacco use. Drug use. Emotional well-being. Home and relationship well-being. Sexual activity. Eating habits. History of falls. Memory and ability to understand (cognition). Work and work Astronomer. Reproductive health. Screening  You may have the following tests or measurements: Height, weight, and BMI. Blood pressure. Lipid and cholesterol levels. These may be checked every 5 years, or more frequently if you are over 72 years old. Skin check. Lung cancer screening. You may have this screening every year starting at age 60 if you have a 30-pack-year history of smoking and currently smoke or have quit within the past 15 years. Fecal occult blood test (FOBT) of the stool. You may have this test every year starting at age 46. Flexible sigmoidoscopy or colonoscopy. You may have a sigmoidoscopy every 5 years or a colonoscopy every 10 years starting at age 9. Hepatitis C blood test. Hepatitis B blood test. Sexually transmitted disease (STD) testing. Diabetes screening. This is done by checking your blood sugar (glucose) after you have not eaten for a while (fasting). You may have this done every 1-3 years. Bone density scan. This is done to screen for osteoporosis. You may have this done starting at age 10. Mammogram. This may be done every 1-2 years. Talk to your health care provider about how often you should  have regular mammograms. Talk with your health care provider about your test results, treatment options, and if necessary, the need for more tests. Vaccines  Your health care provider may recommend certain vaccines, such as: Influenza vaccine. This is recommended every year. Tetanus, diphtheria, and acellular pertussis (Tdap, Td) vaccine. You may need a Td booster every 10 years. Zoster  vaccine. You may need this after age 69. Pneumococcal 13-valent conjugate (PCV13) vaccine. One dose is recommended after age 39. Pneumococcal polysaccharide (PPSV23) vaccine. One dose is recommended after age 43. Talk to your health care provider about which screenings and vaccines you need and how often you need them. This information is not intended to replace advice given to you by your health care provider. Make sure you discuss any questions you have with your health care provider. Document Released: 03/13/2015 Document Revised: 11/04/2015 Document Reviewed: 12/16/2014 Elsevier Interactive Patient Education  2017 [place] Prevention in the Home Falls can cause injuries. They can happen to people of all ages. There are many things you can do to make your home safe and to help prevent falls. What can I do on the outside of my home? Regularly fix the edges of walkways and driveways and fix any cracks. Remove anything that might make you trip as you walk through a door, such as a raised step or threshold. Trim any bushes or trees on the path to your home. Use bright outdoor lighting. Clear any walking paths of anything that might make someone trip, such as rocks or tools. Regularly check to see if handrails are loose or broken. Make sure that both sides of any steps have handrails. Any raised decks and porches should have guardrails on the edges. Have any leaves, snow, or ice cleared regularly. Use sand or salt on walking paths during winter. Clean up any spills in your garage right away. This includes oil or grease spills. What can I do in the bathroom? Use night lights. Install grab bars by the toilet and in the tub and shower. Do not use towel bars as grab bars. Use non-skid mats or decals in the tub or shower. If you need to sit down in the shower, use a plastic, non-slip stool. Keep the floor dry. Clean up any water that spills on the floor as soon as it happens. Remove  soap buildup in the tub or shower regularly. Attach bath mats securely with double-sided non-slip rug tape. Do not have throw rugs and other things on the floor that can make you trip. What can I do in the bedroom? Use night lights. Make sure that you have a light by your bed that is easy to reach. Do not use any sheets or blankets that are too big for your bed. They should not hang down onto the floor. Have a firm chair that has side arms. You can use this for support while you get dressed. Do not have throw rugs and other things on the floor that can make you trip. What can I do in the kitchen? Clean up any spills right away. Avoid walking on wet floors. Keep items that you use a lot in easy-to-reach places. If you need to reach something above you, use a strong step stool that has a grab bar. Keep electrical cords out of the way. Do not use floor polish or wax that makes floors slippery. If you must use wax, use non-skid floor wax. Do not have throw rugs and other things on the floor  that can make you trip. What can I do with my stairs? Do not leave any items on the stairs. Make sure that there are handrails on both sides of the stairs and use them. Fix handrails that are broken or loose. Make sure that handrails are as long as the stairways. Check any carpeting to make sure that it is firmly attached to the stairs. Fix any carpet that is loose or worn. Avoid having throw rugs at the top or bottom of the stairs. If you do have throw rugs, attach them to the floor with carpet tape. Make sure that you have a light switch at the top of the stairs and the bottom of the stairs. If you do not have them, ask someone to add them for you. What else can I do to help prevent falls? Wear shoes that: Do not have high heels. Have rubber bottoms. Are comfortable and fit you well. Are closed at the toe. Do not wear sandals. If you use a stepladder: Make sure that it is fully opened. Do not climb a  closed stepladder. Make sure that both sides of the stepladder are locked into place. Ask someone to hold it for you, if possible. Clearly mark and make sure that you can see: Any grab bars or handrails. First and last steps. Where the edge of each step is. Use tools that help you move around (mobility aids) if they are needed. These include: Canes. Walkers. Scooters. Crutches. Turn on the lights when you go into a dark area. Replace any light bulbs as soon as they burn out. Set up your furniture so you have a clear path. Avoid moving your furniture around. If any of your floors are uneven, fix them. If there are any pets around you, be aware of where they are. Review your medicines with your doctor. Some medicines can make you feel dizzy. This can increase your chance of falling. Ask your doctor what other things that you can do to help prevent falls. This information is not intended to replace advice given to you by your health care provider. Make sure you discuss any questions you have with your health care provider. Document Released: 12/11/2008 Document Revised: 07/23/2015 Document Reviewed: 03/21/2014 Elsevier Interactive Patient Education  2017 [employer].

## 2022-08-15 DIAGNOSIS — M79605 Pain in left leg: Secondary | ICD-10-CM | POA: Diagnosis not present

## 2022-08-15 DIAGNOSIS — M545 Low back pain, unspecified: Secondary | ICD-10-CM | POA: Diagnosis not present

## 2022-08-18 DIAGNOSIS — M545 Low back pain, unspecified: Secondary | ICD-10-CM | POA: Diagnosis not present

## 2022-08-18 DIAGNOSIS — M79605 Pain in left leg: Secondary | ICD-10-CM | POA: Diagnosis not present

## 2022-08-23 DIAGNOSIS — M545 Low back pain, unspecified: Secondary | ICD-10-CM | POA: Diagnosis not present

## 2022-08-23 DIAGNOSIS — M79605 Pain in left leg: Secondary | ICD-10-CM | POA: Diagnosis not present

## 2022-08-31 DIAGNOSIS — M79605 Pain in left leg: Secondary | ICD-10-CM | POA: Diagnosis not present

## 2022-08-31 DIAGNOSIS — M545 Low back pain, unspecified: Secondary | ICD-10-CM | POA: Diagnosis not present

## 2022-09-07 DIAGNOSIS — M79605 Pain in left leg: Secondary | ICD-10-CM | POA: Diagnosis not present

## 2022-09-07 DIAGNOSIS — M545 Low back pain, unspecified: Secondary | ICD-10-CM | POA: Diagnosis not present

## 2022-09-13 DIAGNOSIS — M79605 Pain in left leg: Secondary | ICD-10-CM | POA: Diagnosis not present

## 2022-09-13 DIAGNOSIS — M545 Low back pain, unspecified: Secondary | ICD-10-CM | POA: Diagnosis not present

## 2022-09-15 DIAGNOSIS — D225 Melanocytic nevi of trunk: Secondary | ICD-10-CM | POA: Diagnosis not present

## 2022-09-15 DIAGNOSIS — D2261 Melanocytic nevi of right upper limb, including shoulder: Secondary | ICD-10-CM | POA: Diagnosis not present

## 2022-09-15 DIAGNOSIS — L821 Other seborrheic keratosis: Secondary | ICD-10-CM | POA: Diagnosis not present

## 2022-09-15 DIAGNOSIS — L814 Other melanin hyperpigmentation: Secondary | ICD-10-CM | POA: Diagnosis not present

## 2022-09-15 DIAGNOSIS — D2272 Melanocytic nevi of left lower limb, including hip: Secondary | ICD-10-CM | POA: Diagnosis not present

## 2022-09-15 DIAGNOSIS — D2271 Melanocytic nevi of right lower limb, including hip: Secondary | ICD-10-CM | POA: Diagnosis not present

## 2022-09-15 DIAGNOSIS — D2262 Melanocytic nevi of left upper limb, including shoulder: Secondary | ICD-10-CM | POA: Diagnosis not present

## 2022-09-16 DIAGNOSIS — I447 Left bundle-branch block, unspecified: Secondary | ICD-10-CM | POA: Diagnosis not present

## 2022-09-28 DIAGNOSIS — K222 Esophageal obstruction: Secondary | ICD-10-CM | POA: Diagnosis not present

## 2022-09-28 DIAGNOSIS — K648 Other hemorrhoids: Secondary | ICD-10-CM | POA: Diagnosis not present

## 2022-09-28 DIAGNOSIS — K219 Gastro-esophageal reflux disease without esophagitis: Secondary | ICD-10-CM | POA: Diagnosis not present

## 2022-09-28 DIAGNOSIS — K5902 Outlet dysfunction constipation: Secondary | ICD-10-CM | POA: Diagnosis not present

## 2022-09-28 DIAGNOSIS — K5904 Chronic idiopathic constipation: Secondary | ICD-10-CM | POA: Diagnosis not present

## 2022-10-10 ENCOUNTER — Ambulatory Visit
Admission: EM | Admit: 2022-10-10 | Discharge: 2022-10-10 | Disposition: A | Discharge status: Home / Self Care | Payer: Medicare PPO

## 2022-10-10 DIAGNOSIS — R0981 Nasal congestion: Secondary | ICD-10-CM

## 2022-10-10 DIAGNOSIS — J3489 Other specified disorders of nose and nasal sinuses: Secondary | ICD-10-CM | POA: Diagnosis not present

## 2022-10-10 DIAGNOSIS — J310 Chronic rhinitis: Secondary | ICD-10-CM

## 2022-10-10 NOTE — Discharge Instructions (Signed)
Resume Flonase 2 sprays in both nares twice daily for next 3-4 days an start Cetrizine 10 mg at bedtime until symptoms improve.

## 2022-10-10 NOTE — ED Triage Notes (Signed)
Patient to Urgent Care with complaints of post-nasal drip, mostly left sided. Reports she did cough this morning and had some red tinged mucus production. Symptoms started Saturday. No fevers.   Reports her husband and her daughter have both been sick this week.

## 2022-10-11 NOTE — ED Provider Notes (Signed)
Renaldo Fiddler    CSN: [id number] Arrival date & time: 10/10/22  0820      History   Chief Complaint Chief Complaint  Patient presents with   Nasal Congestion    HPI Dana Arnold is a 72 y.o. female.   HPI Patient presents today with postnasal drip also left-sided.  Did have cough with some red tinge mucus 2 days ago.  Symptoms started approximately 3 days ago.  She reports her husband and daughter have been sick with the same symptoms. She has not had any fever.   Denies shortness of breath or chest tightness. No history of chronic lung disease. Past Medical History:  Diagnosis Date   Cervical radiculitis    Cervicalgia    Chondromalacia    Chronic constipation    Degeneration of lumbar or lumbosacral intervertebral disc    External hemorrhoid, thrombosed    Gastritis, chronic    GERD (gastroesophageal reflux disease)    Hyperglycemia    Hyperlipidemia    Lupus anticoagulant positive    Olecranon bursitis    Osteopenia    Ovarian failure    Paresthesia    Vitamin D deficiency     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   Gastro-esophageal reflux disease without esophagitis 04/14/2021   Tortuous colon 04/14/2021   Osteoporosis 03/31/2021   History of fracture of left hip 10/14/2020   Osteoarthritis 10/22/2019   Chronic neck pain 04/05/2019   Osteoporosis, post-menopausal 01/03/2019   Scoliosis of lumbar spine 04/22/2016   Thoracic scoliosis 09/08/2015   Chondromalacia 03/10/2015    Hyperglycemia 12/24/2014   Cervical radiculitis 12/24/2014   Subclinical hypothyroidism 12/24/2014   Vitamin D deficiency 12/24/2014   Hx of Clostridium difficile infection 12/24/2014   External hemorrhoid 12/24/2014   Chronic gastric erosion 12/24/2014   Chondrocalcinosis of right knee 12/24/2014   DDD (degenerative disc disease), lumbar 12/24/2014    Past Surgical History:  Procedure Laterality Date   arthroscopic knee Right 05/26/2014   CHOLECYSTECTOMY  02/28/1989   DILATION AND CURETTAGE OF UTERUS     HIP PINNING,CANNULATED Left 08/07/2020   Procedure: Left hip percutaneous pinning;  Surgeon: Signa Kell, MD;  Location: [facility] ORS;  Service: Orthopedics;  Laterality: Left;   right ulna surgery Right 07/01/2010   TONSILLECTOMY AND ADENOIDECTOMY Bilateral 03/01/1959    OB History   No obstetric history on file.      Home Medications    Prior to Admission medications   Medication Sig Start Date End Date Taking? Authorizing Provider  metoprolol succinate (TOPROL-XL) 25 MG 24 hr tablet Take 1 tablet by mouth daily. 07/11/22 07/11/23 Yes [provider]  blood glucose meter kit and supplies Dispense based on patient and insurance preference. Use up to four times daily as directed. Hypoglycemia episodes 11/27/18   Alba Cory, MD  cephALEXin (KEFLEX) 500 MG capsule Take by mouth. Patient not taking: Reported on 08/12/2022 05/26/22   [provider]  Cholecalciferol (VITAMIN D) 2000 units tablet Take 3,000 Units by mouth daily.    [provider]  cyanocobalamin 1000  MCG tablet Take 1,000 mcg by mouth 3 (three) times a week.    [provider]  Magnesium 200 MG TABS Take 400 mg by mouth daily.    [provider]  Misc Natural Products (TURMERIC CURCUMIN) CAPS Take 2 capsules by mouth daily.    [provider]    Family History Family History  Problem Relation Age of Onset   Osteoporosis Mother    Aneurysm Father      Social History Social History   Tobacco Use   Smoking status: Never   Smokeless tobacco: Never  Vaping Use   Vaping status: Never Used  Substance Use Topics   Alcohol use: No    Alcohol/week: 0.0 standard drinks of alcohol   Drug use: No     Allergies   Terazol 3 [terconazole], Augmentin [amoxicillin-pot clavulanate], and Macrobid [nitrofurantoin macrocrystal]   Review of Systems Review of Systems Pertinent negatives listed in HPI   Physical Exam Triage Vital Signs ED Triage Vitals [10/10/22 0851]  Encounter Vitals Group     BP 120/68     Systolic BP Percentile      Diastolic BP Percentile      Pulse Rate 62     Resp 16     Temp 97.7 F (36.5 C)     Temp Source Oral     SpO2 96 %     Weight      Height      Head Circumference      Peak Flow      Pain Score 0     Pain Loc      Pain Education      Exclude from Growth Chart    No data found.  Updated Vital Signs BP 120/68 (BP Location: Left Arm)   Pulse 62   Temp 97.7 F (36.5 C) (Oral)   Resp 16   SpO2 96%   Visual Acuity Right Eye Distance:   Left Eye Distance:   Bilateral Distance:    Right Eye Near:   Left Eye Near:    Bilateral Near:     Physical Exam General Appearance:    Alert, cooperative, no distress  HENT:  Normocephalic, TM visible, nares with mucosal edema, ,post nasal drip noted, and nasal mucosa congested  Eyes:    PERRL, conjunctiva/corneas clear, EOM's intact       Lungs:     Clear to auscultation bilaterally, respirations unlabored  Heart:    Regular rate and rhythm  Neurologic:   Awake, alert, oriented x 3. No apparent focal neurological           defect.         UC Treatments / Results  Labs (all labs ordered are listed, but only abnormal results are displayed) Labs Reviewed - No data to display  EKG   Radiology No results found.  Procedures Procedures (including critical care time)  Medications Ordered in UC Medications - No data to display  Initial  Impression / Assessment and Plan / UC Course  I have reviewed the triage vital signs and the nursing notes.  Pertinent labs & imaging results that were available during my care of the patient were reviewed by me and considered in my medical decision making (see chart for details).    Nasal congestion with rhinorrhea and rhinitis. Symptom treatment indicated only with Flonase 2 sprays in each nares lately for the next 3 to 4 days then as needed.  Start cetirizine 10 mg at bedtime  until symptoms improve.  Symptoms appear to be viral in nature and will resolve with symptom treatment and time. Final Clinical Impressions(s) / UC Diagnoses   Final diagnoses:  Nasal congestion with rhinorrhea  Rhinitis, unspecified type     Discharge Instructions      Resume Flonase 2 sprays in both nares twice daily for next 3-4 days an start Cetrizine 10 mg at bedtime until symptoms improve.   ED Prescriptions   None    PDMP not reviewed this encounter.   Bing Neighbors, NP 10/11/22 Windell Moment

## 2022-10-17 DIAGNOSIS — I428 Other cardiomyopathies: Secondary | ICD-10-CM | POA: Diagnosis not present

## 2022-10-17 DIAGNOSIS — I447 Left bundle-branch block, unspecified: Secondary | ICD-10-CM | POA: Diagnosis not present

## 2022-10-17 NOTE — Progress Notes (Deleted)
Name: Dana Arnold   MRN: [id number]    DOB: Jan 10, 1951   Date:10/17/2022       Progress Note  Subjective  Chief Complaint  Follow Up  HPI  Subclinical hypothyroidism: last TSH was 6.5, last time we checked down to 4 51 . She denies  dry skin, she has a long history of dysphagia due to esophageal stenosis   Non ischemic cardiomyopathy/negative calcium score: she was seen by Dr. Gwen Pounds Echo showed moderate LV systolic dysfunction at EF of 30 % she also has LBBB, she is not taking any medications. Discussed going back for a follow up, her bp has been controlled at home but may benefit from low dose ARB/ACE or even SGL-2 agonist She does not have any symptoms or SOB, orthopnea or lower extremity edema  Intestinal malformation: found on CT of abdomen for evaluation of abdominal pain at [facility]  , also found to have fatty liver.She has diarrhea intermittently and is followed by GI    Low Vitamin D level last level was normal, taking supplementation , discussed high calcium diet again , we will recheck labs   B12 insufficieny: she has been taking SL supplementation three times a week. We will recheck labs   Pre-diabetes : she has changed her diet , avoiding sweets, states no recent hypoglycemic episodes last A1C was down from 5.7 % to 5.4 %  Denies polyphagia, polydipsia or polyuria. .  Dyslipidemia: last LDL was 141 but she has atherosclerosis of aorta - seen on CT abdomen pelvis. I sent Crestor but patient never took it since cardiac calcium score was zero, explained that she still has plaque and should take it   Chronic constipation: taking miralax, had pelvic PT and is doing better, sees GI  Osteoporosis: right femur was -2.5 %, she is not taking calcium she is on vitamin D , discussed options of therapy, oral, injectables , she was seen by Endo again this week and is thinking about Reclast   Constipation : seeing GI at [facility] and diagnosed with pelvic floor dyssynergia and is going to start PT  next month, she is also taking Miralax and has helped with her bowel movement   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   Gastro-esophageal reflux disease without esophagitis 04/14/2021   Tortuous colon 04/14/2021   Osteoporosis 03/31/2021   History of fracture of left hip 10/14/2020   Osteoarthritis 10/22/2019   Chronic neck pain 04/05/2019   Osteoporosis, post-menopausal 01/03/2019   Scoliosis of lumbar spine 04/22/2016   Thoracic scoliosis 09/08/2015   Chondromalacia 03/10/2015   Hyperglycemia 12/24/2014   Cervical radiculitis 12/24/2014   Subclinical hypothyroidism 12/24/2014   Vitamin D deficiency 12/24/2014   Hx of Clostridium difficile infection 12/24/2014   External hemorrhoid 12/24/2014   Chronic gastric erosion 12/24/2014   Chondrocalcinosis of right knee 12/24/2014   DDD (degenerative disc disease), lumbar 12/24/2014    Past Surgical History:  Procedure Laterality Date   arthroscopic knee Right 05/26/2014   CHOLECYSTECTOMY  02/28/1989   DILATION AND CURETTAGE OF UTERUS  HIP PINNING,CANNULATED Left 08/07/2020   Procedure: Left hip percutaneous pinning;  Surgeon: Signa Kell, MD;  Location: [facility] ORS;  Service: Orthopedics;  Laterality: Left;   right ulna surgery Right 07/01/2010   TONSILLECTOMY AND ADENOIDECTOMY Bilateral 03/01/1959    Family History  Problem Relation Age of Onset   Osteoporosis Mother    Aneurysm Father     Social History   Tobacco Use   Smoking status: Never   Smokeless  tobacco: Never  Substance Use Topics   Alcohol use: No    Alcohol/week: 0.0 standard drinks of alcohol     Current Outpatient Medications:    blood glucose meter kit and supplies, Dispense based on patient and insurance preference. Use up to four times daily as directed. Hypoglycemia episodes, Disp: 1 each, Rfl: 0   cephALEXin (KEFLEX) 500 MG capsule, Take by mouth. (Patient not taking: Reported on 08/12/2022), Disp: , Rfl:    Cholecalciferol (VITAMIN D) 2000 units tablet, Take 3,000 Units by mouth daily., Disp: , Rfl:    cyanocobalamin 1000 MCG tablet, Take 1,000 mcg by mouth 3 (three) times a week., Disp: , Rfl:    Magnesium 200 MG TABS, Take 400 mg by mouth daily., Disp: , Rfl:    metoprolol succinate (TOPROL-XL) 25 MG 24 hr tablet, Take 1 tablet by mouth daily., Disp: , Rfl:    Misc Natural Products (TURMERIC CURCUMIN) CAPS, Take 2 capsules by mouth daily., Disp: , Rfl:   Allergies  Allergen Reactions   Terazol 3 [Terconazole] Swelling and Rash   Augmentin [Amoxicillin-Pot Clavulanate] Itching    Hands    Macrobid [Nitrofurantoin Macrocrystal]     I personally reviewed active problem list, medication list, allergies, family history, social history, health maintenance with the patient/caregiver today.   ROS  ***  Objective  There were no vitals filed for this visit.  There is no height or weight on file to calculate BMI.  Physical Exam ***  No results found for this or any previous visit (from the past 2160 hour(s)).   PHQ2/9:    08/12/2022   10:50 AM 05/27/2022    2:38 PM 04/15/2022    8:12 AM 10/13/2021    8:16 AM 07/08/2021   10:52 AM  Depression screen PHQ 2/9  Decreased Interest 0 0 0 0 0  Down, Depressed, Hopeless 0 0 0 0 0  PHQ - 2 Score 0 0 0 0 0  Altered sleeping  0 0 0   Tired, decreased energy  0 0 0   Change in appetite  0 0 0   Feeling bad or failure about yourself   0 0 0   Trouble concentrating  0 0 0   Moving slowly or fidgety/restless  0 0 0    Suicidal thoughts  0 0 0   PHQ-9 Score  0 0 0     phq 9 is {gen pos [id number]}   Fall Risk:    08/12/2022   10:48 AM 05/27/2022    2:38 PM 04/15/2022    8:12 AM 10/13/2021    8:16 AM 07/08/2021   10:53 AM  Fall Risk   Falls in the past year? 0 0 0 0 1  Number falls in past yr: 0  0 0 0  Injury with Fall? 0  0 0 1  Risk for fall due to : No Fall Risks No Fall Risks No Fall Risks No Fall Risks History of fall(s)  Follow up Education provided;Falls prevention discussed Falls prevention discussed Falls  prevention discussed Falls prevention discussed Falls prevention discussed      Functional Status Survey:      Assessment & Plan  *** There are no diagnoses linked to this encounter.

## 2022-10-18 ENCOUNTER — Ambulatory Visit: Payer: Medicare PPO | Admitting: Family Medicine

## 2022-10-19 NOTE — Progress Notes (Unsigned)
Name: Dana Arnold   MRN: [id number]    DOB: [birth year]   Date:10/20/2022       Progress Note  Subjective  Chief Complaint  Follow Up  HPI  Subclinical hypothyroidism: last TSH was 5.78.  She denies dry skin or change in bowel movements - she has chronic constipation and see GI at [facility]   Non ischemic cardiomyopathy/negative calcium score: she was seen by Dr. Caro Hight  in October showed moderate LV systolic dysfunction at EF of 30 % she also has LBBB, currently seeing Dr. Ozella Almond and repeat study showed EF of 42 % she is now taking metoprolol . She denies SOB, orthopnea or lower extremity edema  GI problems: she has Intestinal malformation: found on CT of abdomen for evaluation of abdominal pain at [facility]  , also found to have fatty liver. She also has hemorrhoids, esophageal strictures and s/p dilation - doing well at this time, under the care of GI, she was  diagnosed with pelvic floor dyssynergia and she had PT,  she is also taking Miralax and has helped with her bowel movement.   Low Vitamin D level last level was normal, taking supplementation , she eats a high calcium diet   B12 insufficieny: she has been taking SL supplementation three times a week. Last level was at goal   Pre-diabetes : she has changed her diet , avoiding sweets, states no recent hypoglycemic episodes last A1C was down from 5.7 % to 5.4 % but last level was up to 5.9 % , we will continue to monitor for now   Dyslipidemia/Atherosclerosis of aorta - seen on CT abdomen pelvis. I sent Crestor but patient never took it since cardiac calcium score was zero, cardiologist did not advise statin therapy and last LDL was down from 141 too 213   Osteoporosis: right femur was -2.5 %, she is not taking calcium she is on vitamin D , discussed options of therapy, oral, injectables , she was seen by Endo and discussed options but has not started any therapy yet.    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   Gastro-esophageal reflux disease without esophagitis 04/14/2021   Tortuous colon 04/14/2021   Osteoporosis 03/31/2021   History of fracture of left hip 10/14/2020   Osteoarthritis 10/22/2019   Chronic neck pain 04/05/2019   Osteoporosis, post-menopausal 01/03/2019   Scoliosis of lumbar spine 04/22/2016   Thoracic scoliosis 09/08/2015   Chondromalacia 03/10/2015   Hyperglycemia 12/24/2014   Cervical radiculitis 12/24/2014   Subclinical hypothyroidism 12/24/2014   Vitamin D deficiency 12/24/2014   Hx of Clostridium difficile infection 12/24/2014   External hemorrhoid 12/24/2014   Chronic gastric erosion 12/24/2014   Chondrocalcinosis of right knee 12/24/2014   DDD (degenerative disc disease), lumbar 12/24/2014    Past Surgical History:  Procedure Laterality Date   arthroscopic knee Right 05/26/2014   CHOLECYSTECTOMY  02/28/1989   DILATION AND CURETTAGE OF UTERUS     HIP PINNING,CANNULATED Left 08/07/2020   Procedure: Left hip percutaneous pinning;  Surgeon: Signa Kell, MD;  Location: [facility] ORS;  Service: Orthopedics;  Laterality: Left;   right ulna surgery Right 07/01/2010   TONSILLECTOMY AND ADENOIDECTOMY Bilateral 03/01/1959    Family History  Problem Relation Age of Onset   Osteoporosis Mother    Aneurysm Father     Social History   Tobacco Use   Smoking status: Never   Smokeless tobacco: Never  Substance Use Topics   Alcohol use: No    Alcohol/week: 0.0 standard drinks of alcohol     Current Outpatient Medications:     blood glucose meter kit and supplies, Dispense based on patient and insurance preference. Use up to four times daily as directed. Hypoglycemia episodes, Disp: 1 each, Rfl: 0   Cholecalciferol (VITAMIN D) 2000 units tablet, Take 3,000 Units by mouth daily., Disp: , Rfl:    cyanocobalamin 1000 MCG tablet, Take 1,000 mcg by mouth 3 (three) times a week., Disp: , Rfl:    Magnesium 200 MG TABS, Take 400 mg by mouth daily., Disp: , Rfl:    metoprolol succinate (TOPROL-XL) 25 MG 24 hr tablet, Take 1 tablet by mouth daily., Disp: , Rfl:    Misc Natural Products (TURMERIC CURCUMIN) CAPS, Take 2 capsules by mouth daily., Disp: , Rfl:    cephALEXin (KEFLEX) 500 MG capsule, Take by mouth. (Patient not taking: Reported on 08/12/2022), Disp: , Rfl:   Allergies  Allergen Reactions   Terazol 3 [Terconazole] Swelling and Rash   Augmentin [Amoxicillin-Pot Clavulanate] Itching    Hands    Macrobid [Nitrofurantoin Macrocrystal]     I personally reviewed active problem list, medication list, allergies, family history, social history, health maintenance with the patient/caregiver today.   ROS  Ten systems reviewed and is negative except as mentioned in HPI    Objective  Vitals:   10/20/22 0755  BP: 120/68  Pulse: 77  Resp: 16  SpO2: 98%  Weight: 132 lb (59.9 kg)  Height: 5\' 4"  (1.626 m)    Body mass index is 22.66 kg/m.  Physical Exam Constitutional: Patient appears well-developed and well-nourished.  No distress.  HEENT: head atraumatic, normocephalic, pupils equal and reactive to light, neck supple Cardiovascular: Normal rate, regular rhythm and normal heart sounds.  No murmur heard. No BLE edema. Pulmonary/Chest: Effort normal and breath sounds normal. No respiratory distress. Abdominal: Soft.  There is no tenderness. Psychiatric: Patient has a normal mood and affect. behavior is normal. Judgment and thought content normal.     PHQ2/9:    10/20/2022    7:55 AM 08/12/2022   10:50 AM  05/27/2022    2:38 PM 04/15/2022    8:12 AM 10/13/2021    8:16 AM  Depression screen PHQ 2/9  Decreased Interest 0 0 0 0 0  Down, Depressed, Hopeless 0 0 0 0 0  PHQ - 2 Score 0 0 0 0 0  Altered sleeping 0  0 0 0  Tired, decreased energy 0  0 0 0  Change in appetite 0  0 0 0  Feeling bad or failure about yourself  0  0 0 0  Trouble concentrating 0  0 0 0  Moving slowly or fidgety/restless 0  0 0 0  Suicidal thoughts 0  0 0 0  PHQ-9 Score 0  0 0 0    phq 9 is negative   Fall Risk:    10/20/2022    7:55 AM 08/12/2022   10:48 AM 05/27/2022    2:38 PM 04/15/2022    8:12 AM 10/13/2021    8:16 AM  Fall  Risk   Falls in the past year? 0 0 0 0 0  Number falls in past yr: 0 0  0 0  Injury with Fall? 0 0  0 0  Risk for fall due to : No Fall Risks No Fall Risks No Fall Risks No Fall Risks No Fall Risks  Follow up Falls prevention discussed Education provided;Falls prevention discussed Falls prevention discussed Falls prevention discussed Falls prevention discussed      Functional Status Survey: Is the patient deaf or have difficulty hearing?: No Does the patient have difficulty seeing, even when wearing glasses/contacts?: No Does the patient have difficulty concentrating, remembering, or making decisions?: No Does the patient have difficulty walking or climbing stairs?: No Does the patient have difficulty dressing or bathing?: No Does the patient have difficulty doing errands alone such as visiting a doctor's office or shopping?: No    Assessment & Plan  1. Atherosclerosis of aorta (HCC)  Not on statin therapy   2. Non-ischemic cardiomyopathy (HCC)  Under the care of Cardiologist   3. B12 deficiency  Continue supplementation   4. Vitamin D deficiency  Continue supplementation   5. Age-related osteoporosis without current pathological fracture  Contact Endo when ready to start therapy   6. Subclinical hypothyroidism  - TSH + free T4  7. Hyperglycemia  -  Hemoglobin A1c

## 2022-10-20 ENCOUNTER — Ambulatory Visit: Payer: Medicare PPO | Admitting: Family Medicine

## 2022-10-20 ENCOUNTER — Encounter: Payer: Self-pay | Admitting: Family Medicine

## 2022-10-20 VITALS — BP 120/68 | HR 77 | Resp 16 | Ht 64.0 in | Wt 132.0 lb

## 2022-10-20 DIAGNOSIS — E038 Other specified hypothyroidism: Secondary | ICD-10-CM | POA: Diagnosis not present

## 2022-10-20 DIAGNOSIS — E559 Vitamin D deficiency, unspecified: Secondary | ICD-10-CM

## 2022-10-20 DIAGNOSIS — I428 Other cardiomyopathies: Secondary | ICD-10-CM

## 2022-10-20 DIAGNOSIS — E538 Deficiency of other specified B group vitamins: Secondary | ICD-10-CM | POA: Diagnosis not present

## 2022-10-20 DIAGNOSIS — M810 Age-related osteoporosis without current pathological fracture: Secondary | ICD-10-CM | POA: Diagnosis not present

## 2022-10-20 DIAGNOSIS — I700 Atherosclerosis of aorta: Secondary | ICD-10-CM

## 2022-10-20 DIAGNOSIS — R739 Hyperglycemia, unspecified: Secondary | ICD-10-CM | POA: Diagnosis not present

## 2022-10-21 LAB — HEMOGLOBIN A1C
Hgb A1c MFr Bld: 6 %{Hb} — ABNORMAL HIGH (ref <5.7)
Mean Plasma Glucose: 126 mg/dL
eAG (mmol/L): 7 mmol/L

## 2022-10-21 LAB — TSH+FREE T4: TSH W/REFLEX TO FT4: 3.87 m[IU]/L (ref 0.40–4.50)

## 2022-11-12 DIAGNOSIS — J029 Acute pharyngitis, unspecified: Secondary | ICD-10-CM | POA: Diagnosis not present

## 2022-11-12 DIAGNOSIS — Z03818 Encounter for observation for suspected exposure to other biological agents ruled out: Secondary | ICD-10-CM | POA: Diagnosis not present

## 2022-11-22 DIAGNOSIS — I447 Left bundle-branch block, unspecified: Secondary | ICD-10-CM | POA: Diagnosis not present

## 2022-11-22 DIAGNOSIS — I428 Other cardiomyopathies: Secondary | ICD-10-CM | POA: Diagnosis not present

## 2023-01-04 DIAGNOSIS — K5904 Chronic idiopathic constipation: Secondary | ICD-10-CM | POA: Diagnosis not present

## 2023-01-04 DIAGNOSIS — K648 Other hemorrhoids: Secondary | ICD-10-CM | POA: Diagnosis not present

## 2023-01-04 DIAGNOSIS — K222 Esophageal obstruction: Secondary | ICD-10-CM | POA: Diagnosis not present

## 2023-01-04 DIAGNOSIS — K5902 Outlet dysfunction constipation: Secondary | ICD-10-CM | POA: Diagnosis not present

## 2023-01-09 DIAGNOSIS — I447 Left bundle-branch block, unspecified: Secondary | ICD-10-CM | POA: Diagnosis not present

## 2023-01-09 DIAGNOSIS — E782 Mixed hyperlipidemia: Secondary | ICD-10-CM | POA: Diagnosis not present

## 2023-01-09 DIAGNOSIS — I428 Other cardiomyopathies: Secondary | ICD-10-CM | POA: Diagnosis not present

## 2023-01-11 ENCOUNTER — Ambulatory Visit: Payer: Medicare PPO | Admitting: Internal Medicine

## 2023-01-11 VITALS — BP 126/70 | HR 87 | Temp 98.4°F | Resp 16 | Ht 64.0 in | Wt 131.0 lb

## 2023-01-11 DIAGNOSIS — N3090 Cystitis, unspecified without hematuria: Secondary | ICD-10-CM | POA: Diagnosis not present

## 2023-01-11 DIAGNOSIS — R300 Dysuria: Secondary | ICD-10-CM | POA: Diagnosis not present

## 2023-01-11 DIAGNOSIS — R310 Gross hematuria: Secondary | ICD-10-CM | POA: Diagnosis not present

## 2023-01-11 LAB — POCT URINALYSIS DIPSTICK
Bilirubin, UA: NEGATIVE
Glucose, UA: NEGATIVE
Nitrite, UA: POSITIVE — AB
Protein, UA: POSITIVE — AB
Spec Grav, UA: 1.01 (ref 1.010–1.025)
Urobilinogen, UA: 0.2 U/dL
pH, UA: 6.5 (ref 5.0–8.0)

## 2023-01-11 MED ORDER — LIDOCAINE-EPINEPHRINE (PF) 1 %-1:200000 IJ SOLN: 10.0000 mL | Freq: Once | INTRAMUSCULAR | Status: DC

## 2023-01-11 MED ORDER — CEFTRIAXONE SODIUM 500 MG IJ SOLR
500.0000 mg | Freq: Once | INTRAMUSCULAR | Status: AC
  Administered 2023-01-11: 1 g via INTRAMUSCULAR

## 2023-01-11 MED ORDER — CEFTRIAXONE SODIUM 1 G IJ SOLR: 1.0000 g | Freq: Once | INTRAMUSCULAR | Status: DC

## 2023-01-11 MED ORDER — LIDOCAINE HCL (PF) 1 % IJ SOLN
2.0000 mL | Freq: Once | INTRAMUSCULAR | Status: AC
  Administered 2023-01-11: 2 mL via INTRADERMAL

## 2023-01-11 NOTE — Progress Notes (Signed)
   Acute Office Visit  Subjective:     Patient ID: Dana Arnold, female    DOB: 06/05/1950, 72 y.o.   MRN: [id number]  Chief Complaint  Patient presents with   Urinary Tract Infection    Urinary Tract Infection  Associated symptoms include frequency, hematuria and urgency. Pertinent negatives include no chills or flank pain.   Patient is in today for UTI concerns. Usually drinks a lot of water but has been drinking less lately. Woke up yesterday with pelvic pressure and then this morning woke up with dysuria. She also is having gross hematuria and thinks she passed a few small clots. She does have lower pelvic pain/pressure and right sided abdominal pain but no flank pain or fevers. Does endorse increased urinary urgency and frequency.  Review of Systems  Constitutional:  Negative for chills and fever.  Gastrointestinal:  Positive for abdominal pain.  Genitourinary:  Positive for dysuria, frequency, hematuria and urgency. Negative for flank pain.        Objective:    BP 126/70   Pulse 87   Resp 16   Ht 5\' 4"  (1.626 m)   Wt 131 lb (59.4 kg)   SpO2 99%   BMI 22.49 kg/m  BP Readings from Last 3 Encounters:  01/11/23 126/70  10/20/22 120/68  10/10/22 120/68   Wt Readings from Last 3 Encounters:  01/11/23 131 lb (59.4 kg)  10/20/22 132 lb (59.9 kg)  08/12/22 131 lb (59.4 kg)      Physical Exam Constitutional:      Appearance: Normal appearance.  HENT:     Head: Normocephalic and atraumatic.  Eyes:     Conjunctiva/sclera: Conjunctivae normal.  Cardiovascular:     Rate and Rhythm: Normal rate and regular rhythm.  Pulmonary:     Effort: Pulmonary effort is normal.     Breath sounds: Normal breath sounds.  Abdominal:     Tenderness: There is no right CVA tenderness or left CVA tenderness.  Skin:    General: Skin is warm and dry.  Neurological:     General: No focal deficit present.     Mental Status: She is alert. Mental status is at baseline.  Psychiatric:         Mood and Affect: Mood normal.        Behavior: Behavior normal.     No results found for any visits on 01/11/23.      Assessment & Plan:   1. Dysuria/Gross hematuria: Urine with gross hematuria, UA positive for blood, leukocytes and nitrates. Will send for culture and treat with Rocephin injection today. Obtain labs to assess white count, kidney function, check CK to rule out rhabdomyolysis. Will follow up in 1 week for recheck, will require renal CT if gross hematuria continues.   - POCT Urinalysis Dipstick - CULTURE, URINE COMPREHENSIVE - COMPLETE METABOLIC PANEL WITH GFR - CBC w/Diff/Platelet - CK (Creatine Kinase) - Urine Culture - cefTRIAXone (ROCEPHIN) injection 1 g   Return in about 1 week (around 01/18/2023).  Margarita Mail, DO

## 2023-01-12 LAB — COMPLETE METABOLIC PANEL WITH GFR
AG Ratio: 1.6 (calc) (ref 1.0–2.5)
ALT: 19 U/L (ref 6–29)
AST: 24 U/L (ref 10–35)
Albumin: 4.1 g/dL (ref 3.6–5.1)
Alkaline phosphatase (APISO): 74 U/L (ref 37–153)
BUN: 15 mg/dL (ref 7–25)
CO2: 30 mmol/L (ref 20–32)
Calcium: 9.5 mg/dL (ref 8.6–10.4)
Chloride: 101 mmol/L (ref 98–110)
Creat: 0.74 mg/dL (ref 0.50–1.05)
Globulin: 2.6 g/dL (ref 1.9–3.7)
Glucose, Bld: 84 mg/dL (ref 65–99)
Potassium: 4.6 mmol/L (ref 3.5–5.3)
Sodium: 137 mmol/L (ref 135–146)
Total Bilirubin: 0.6 mg/dL (ref 0.2–1.2)
Total Protein: 6.7 g/dL (ref 6.1–8.1)
eGFR: 88 mL/min/{1.73_m2} (ref >=60)

## 2023-01-12 LAB — CBC WITH DIFFERENTIAL/PLATELET
Absolute Lymphocytes: 1570 {cells}/uL (ref 850–3900)
Absolute Monocytes: 988 {cells}/uL — ABNORMAL HIGH (ref 200–950)
Basophils Absolute: 42 {cells}/uL (ref 0–200)
Basophils Relative: 0.4 %
Eosinophils Absolute: 52 {cells}/uL (ref 15–500)
Eosinophils Relative: 0.5 %
HCT: 42.5 % (ref 35.0–45.0)
Hemoglobin: 13.5 g/dL (ref 11.7–15.5)
MCH: 30.1 pg (ref 27.0–33.0)
MCHC: 31.8 g/dL — ABNORMAL LOW (ref 32.0–36.0)
MCV: 94.7 fL (ref 80.0–100.0)
MPV: 11.6 fL (ref 7.5–12.5)
Monocytes Relative: 9.5 %
Neutro Abs: 7748 {cells}/uL (ref 1500–7800)
Neutrophils Relative %: 74.5 %
Platelets: 230 10*3/uL (ref 140–400)
RBC: 4.49 10*6/uL (ref 3.80–5.10)
RDW: 12.1 % (ref 11.0–15.0)
Total Lymphocyte: 15.1 %
WBC: 10.4 10*3/uL (ref 3.8–10.8)

## 2023-01-12 LAB — CK: Total CK: 66 U/L (ref 29–143)

## 2023-01-13 ENCOUNTER — Telehealth: Payer: Self-pay | Admitting: Family Medicine

## 2023-01-13 ENCOUNTER — Encounter: Payer: Self-pay | Admitting: Internal Medicine

## 2023-01-13 LAB — URINE CULTURE
MICRO NUMBER:: 15726170
SPECIMEN QUALITY:: ADEQUATE

## 2023-01-13 NOTE — Telephone Encounter (Signed)
Pt is calling in because she saw her urine culture and it had E.coli in it. Pt wanted to know did she need another medication besides the injection she received. Please follow up with pt.

## 2023-01-15 MED ORDER — SULFAMETHOXAZOLE-TRIMETHOPRIM 800-160 MG PO TABS: 1.0000 | ORAL_TABLET | Freq: Two times a day (BID) | ORAL | 0 refills | Status: AC

## 2023-01-15 NOTE — Addendum Note (Signed)
Addended by: Margarita Mail on: 01/15/2023 06:15 PM   Modules accepted: Orders

## 2023-01-16 NOTE — Telephone Encounter (Signed)
The patient has made an additional phone call regarding their concern   The patient would like to know if they should still attend their appt for 01/17/23  Pease contact when possible

## 2023-01-16 NOTE — Progress Notes (Deleted)
   Acute Office Visit  Subjective:     Patient ID: SHAWNNA SAMRA, female    DOB: December 19, 1950, 72 y.o.   MRN: [id number]  No chief complaint on file.   Patient is in today for follow up on UTI. Initially had pelvic pressure and dysuria with gross hematuria and lower pelvic pain/pressure and right sided abdominal pain but no flank pain or fevers. Does endorse increased urinary urgency and frequency. Labs were normal, urine culture growing e coli. Was given Ceftriaxone in the office, then oral Bactrim for 3 days. Today she states   Review of Systems  Constitutional:  Negative for chills and fever.  Gastrointestinal:  Positive for abdominal pain.  Genitourinary:  Positive for dysuria, frequency, hematuria and urgency. Negative for flank pain.        Objective:    There were no vitals taken for this visit. BP Readings from Last 3 Encounters:  01/11/23 126/70  10/20/22 120/68  10/10/22 120/68   Wt Readings from Last 3 Encounters:  01/11/23 131 lb (59.4 kg)  10/20/22 132 lb (59.9 kg)  08/12/22 131 lb (59.4 kg)      Physical Exam Constitutional:      Appearance: Normal appearance.  HENT:     Head: Normocephalic and atraumatic.  Eyes:     Conjunctiva/sclera: Conjunctivae normal.  Cardiovascular:     Rate and Rhythm: Normal rate and regular rhythm.  Pulmonary:     Effort: Pulmonary effort is normal.     Breath sounds: Normal breath sounds.  Abdominal:     Tenderness: There is no right CVA tenderness or left CVA tenderness.  Skin:    General: Skin is warm and dry.  Neurological:     General: No focal deficit present.     Mental Status: She is alert. Mental status is at baseline.  Psychiatric:        Mood and Affect: Mood normal.        Behavior: Behavior normal.     No results found for any visits on 01/17/23.      Assessment & Plan:   1. Dysuria/Gross hematuria: Urine with gross hematuria, UA positive for blood, leukocytes and nitrates. Will send for culture and  treat with Rocephin injection today. Obtain labs to assess white count, kidney function, check CK to rule out rhabdomyolysis. Will follow up in 1 week for recheck, will require renal CT if gross hematuria continues.   - POCT Urinalysis Dipstick - CULTURE, URINE COMPREHENSIVE - COMPLETE METABOLIC PANEL WITH GFR - CBC w/Diff/Platelet - CK (Creatine Kinase) - Urine Culture - cefTRIAXone (ROCEPHIN) injection 1 g   No follow-ups on file.  Margarita Mail, DO

## 2023-01-16 NOTE — Telephone Encounter (Signed)
Left vm

## 2023-01-17 ENCOUNTER — Ambulatory Visit: Payer: Medicare PPO | Admitting: Internal Medicine

## 2023-01-19 ENCOUNTER — Ambulatory Visit: Payer: Medicare PPO | Admitting: Family Medicine

## 2023-01-23 ENCOUNTER — Other Ambulatory Visit: Payer: Self-pay

## 2023-01-23 ENCOUNTER — Ambulatory Visit (INDEPENDENT_AMBULATORY_CARE_PROVIDER_SITE_OTHER): Payer: Medicare PPO | Admitting: Nurse Practitioner

## 2023-01-23 ENCOUNTER — Encounter: Payer: Self-pay | Admitting: Nurse Practitioner

## 2023-01-23 VITALS — BP 112/80 | HR 76 | Temp 98.1°F | Resp 16 | Ht 64.0 in | Wt 132.9 lb

## 2023-01-23 DIAGNOSIS — N3001 Acute cystitis with hematuria: Secondary | ICD-10-CM | POA: Diagnosis not present

## 2023-01-23 LAB — POCT URINALYSIS DIPSTICK
Bilirubin, UA: NEGATIVE
Blood, UA: NEGATIVE
Glucose, UA: NEGATIVE
Ketones, UA: NEGATIVE
Leukocytes, UA: NEGATIVE
Nitrite, UA: NEGATIVE
Protein, UA: NEGATIVE
Spec Grav, UA: 1.02 (ref 1.010–1.025)
Urobilinogen, UA: 0.2 U/dL
pH, UA: 5 (ref 5.0–8.0)

## 2023-01-23 NOTE — Progress Notes (Signed)
BP 112/80   Pulse 76   Temp 98.1 F (36.7 C) (Oral)   Resp 16   Ht 5\' 4"  (1.626 m)   Wt 132 lb 14.4 oz (60.3 kg)   SpO2 98%   BMI 22.81 kg/m    Subjective:    Patient ID: Dana Arnold, female    DOB: 1950/03/25, 72 y.o.   MRN: [id number]  HPI: Dana Arnold is a 72 y.o. female  Chief Complaint  Patient presents with   Follow-up    UTI   Note from previous visit with Dr. Caralee Ates:  Urine with gross hematuria, UA positive for blood, leukocytes and nitrates. Will send for culture and treat with Rocephin injection today. Obtain labs to assess white count, kidney function, check CK to rule out rhabdomyolysis. Will follow up in 1 week for recheck, will require renal CT if gross hematuria continues.    - POCT Urinalysis Dipstick - CULTURE, URINE COMPREHENSIVE - COMPLETE METABOLIC PANEL WITH GFR - CBC w/Diff/Platelet - CK (Creatine Kinase) - Urine Culture - cefTRIAXone (ROCEPHIN) injection 1 g  Urine culture grew e. Coli, she was started on bactrim for 3 days. Patient is here today for recheck.  Other labs were normal.   Discussed the use of AI scribe software for clinical note transcription with the patient, who gave verbal consent to proceed.  History of Present Illness   The patient,  presents for follow-up after a recent UTI. They were treated with a shot of Rocephin and a three-day course of Bactrim. They report feeling 'much better' and deny fever, dysuria, and urinary frequency. They have had some back pain, but it is 'very minimal.' They also report some tenderness in the neck, but deny any associated movement limitations.    Relevant past medical, surgical, family and social history reviewed and updated as indicated. Interim medical history since our last visit reviewed. Allergies and medications reviewed and updated.  Review of Systems  Constitutional: Negative for fever or weight change.  Respiratory: Negative for cough and shortness of breath.   Cardiovascular:  Negative for chest pain or palpitations.  Gastrointestinal: Negative for abdominal pain, no bowel changes.  Musculoskeletal: Negative for gait problem or joint swelling.  Skin: Negative for rash.  Neurological: Negative for dizziness or headache.  No other specific complaints in a complete review of systems (except as listed in HPI above).      Objective:    BP 112/80   Pulse 76   Temp 98.1 F (36.7 C) (Oral)   Resp 16   Ht 5\' 4"  (1.626 m)   Wt 132 lb 14.4 oz (60.3 kg)   SpO2 98%   BMI 22.81 kg/m   Wt Readings from Last 3 Encounters:  01/23/23 132 lb 14.4 oz (60.3 kg)  01/11/23 131 lb (59.4 kg)  10/20/22 132 lb (59.9 kg)    Physical Exam  Constitutional: Patient appears well-developed and well-nourished.  No distress.  HEENT: head atraumatic, normocephalic, pupils equal and reactive to light, neck supple Cardiovascular: Normal rate, regular rhythm and normal heart sounds.  No murmur heard. No BLE edema. Pulmonary/Chest: Effort normal and breath sounds normal. No respiratory distress. Abdominal: Soft.  There is no tenderness. No CVA tenderness Psychiatric: Patient has a normal mood and affect. behavior is normal. Judgment and thought content normal.  Results for orders placed or performed in visit on 01/23/23  POCT urinalysis dipstick  Result Value Ref Range   Color, UA yellow    Clarity, UA clear  Glucose, UA Negative Negative   Bilirubin, UA negative    Ketones, UA negative    Spec Grav, UA 1.020 1.010 - 1.025   Blood, UA negative    pH, UA 5.0 5.0 - 8.0   Protein, UA Negative Negative   Urobilinogen, UA 0.2 0.2 or 1.0 E.U./dL   Nitrite, UA negative    Leukocytes, UA Negative Negative   Appearance clear    Odor none       Assessment & Plan:   Problem List Items Addressed This Visit   None Visit Diagnoses     Acute cystitis with hematuria    -  Primary   Relevant Orders   POCT urinalysis dipstick (Completed)       Assessment and Plan    Urinary  Tract Infection Recent UTI treated with Bactrim for three days after initial presentation with hematuria. No current urinary symptoms. Mild back pain reported, but no renal tenderness on examination. Urinalysis today shows no blood or signs of infection. - No further action required at this time.  Back Pain Mild, non-movement related back pain reported. No renal tenderness on examination. - Monitor symptoms. No further action required at this time.        Follow up plan: Return if symptoms worsen or fail to improve.

## 2023-02-10 ENCOUNTER — Ambulatory Visit: Payer: Self-pay
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
## 2023-02-13 ENCOUNTER — Ambulatory Visit (INDEPENDENT_AMBULATORY_CARE_PROVIDER_SITE_OTHER): Payer: Medicare PPO | Admitting: Physician Assistant

## 2023-02-13 VITALS — BP 118/68 | HR 70 | Resp 16 | Ht 64.0 in | Wt 132.0 lb

## 2023-02-13 DIAGNOSIS — K648 Other hemorrhoids: Secondary | ICD-10-CM

## 2023-02-13 DIAGNOSIS — K625 Hemorrhage of anus and rectum: Secondary | ICD-10-CM
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
## 2023-02-14 DIAGNOSIS — H2513 Age-related nuclear cataract, bilateral: Secondary | ICD-10-CM | POA: Diagnosis not present

## 2023-02-14 DIAGNOSIS — H00014 Hordeolum externum left upper eyelid: Secondary | ICD-10-CM | POA: Diagnosis not present

## 2023-02-14 LAB — COMPLETE METABOLIC PANEL WITH GFR
AG Ratio: 1.8 (calc) (ref 1.0–2.5)
ALT: 15 U/L (ref 6–29)
AST: 22 U/L (ref 10–35)
Albumin: 4.2 g/dL (ref 3.6–5.1)
Alkaline phosphatase (APISO): 60 U/L (ref 37–153)
BUN: 16 mg/dL (ref 7–25)
CO2: 29 mmol/L (ref 20–32)
Calcium: 9.7 mg/dL (ref 8.6–10.4)
Chloride: 104 mmol/L (ref 98–110)
Creat: 0.69 mg/dL (ref 0.50–1.05)
Globulin: 2.4 g/dL (ref 1.9–3.7)
Glucose, Bld: 77 mg/dL (ref 65–99)
Potassium: 4.4 mmol/L (ref 3.5–5.3)
Sodium: 140 mmol/L (ref 135–146)
Total Bilirubin: 0.4 mg/dL (ref 0.2–1.2)
Total Protein: 6.6 g/dL (ref 6.1–8.1)
eGFR: 94 mL/min/{1.73_m2} (ref >=60)

## 2023-02-14 LAB — CBC WITH DIFFERENTIAL/PLATELET
Absolute Lymphocytes: 1995 {cells}/uL (ref 850–3900)
Absolute Monocytes: 440 {cells}/uL (ref 200–950)
Basophils Absolute: 40 {cells}/uL (ref 0–200)
Basophils Relative: 0.8 %
Eosinophils Absolute: 90 {cells}/uL (ref 15–500)
Eosinophils Relative: 1.8 %
HCT: 42 % (ref 35.0–45.0)
Hemoglobin: 13.4 g/dL (ref 11.7–15.5)
MCH: 30.6 pg (ref 27.0–33.0)
MCHC: 31.9 g/dL — ABNORMAL LOW (ref 32.0–36.0)
MCV: 95.9 fL (ref 80.0–100.0)
MPV: 11.5 fL (ref 7.5–12.5)
Monocytes Relative: 8.8 %
Neutro Abs: 2435 {cells}/uL (ref 1500–7800)
Neutrophils Relative %: 48.7 %
Platelets: 235 10*3/uL (ref 140–400)
RBC: 4.38 10*6/uL (ref 3.80–5.10)
RDW: 12.1 % (ref 11.0–15.0)
Total Lymphocyte: 39.9 %
WBC: 5 10*3/uL (ref 3.8–10.8)

## 2023-02-17 NOTE — Progress Notes (Signed)
Your labs are back  Your CBC was overall normal - no signs of anemia at this time  Your electrolytes, liver and kidney function were overall normal at this time

## 2023-02-24 ENCOUNTER — Ambulatory Visit
Admission: EM | Admit: 2023-02-24 | Discharge: 2023-02-24 | Disposition: A | Discharge status: Home / Self Care | Payer: Medicare PPO | Attending: Emergency Medicine | Admitting: Emergency Medicine

## 2023-02-24 DIAGNOSIS — J069 Acute upper respiratory infection, unspecified: Secondary | ICD-10-CM | POA: Diagnosis not present

## 2023-02-24 DIAGNOSIS — R042 Hemoptysis: Secondary | ICD-10-CM

## 2023-02-24 MED ORDER — AZITHROMYCIN 250 MG PO TABS: 250.0000 mg | ORAL_TABLET | Freq: Every day | ORAL | 0 refills | Status: DC

## 2023-02-24 NOTE — ED Provider Notes (Signed)
Renaldo Fiddler    CSN: [id number] Arrival date & time: 02/24/23  1130      History   Chief Complaint Chief Complaint  Patient presents with   Cough   Nasal Congestion    HPI Dana Arnold is a 72 y.o. female.  Patient presents with hoarse voice x 5 days.  She reports 4 days of sneezing, congestion, runny nose, cough.  She noted yellow mucus tinged with specks of blood this morning.  She has been taking Mucinex for her symptoms.  She denies fever, shortness of breath, chest pain.  Her medical history includes C. difficile twice in the past when taking antibiotics.  The history is provided by the patient and medical records.    Past Medical History:  Diagnosis Date   Cervical radiculitis    Cervicalgia    Chondromalacia    Chronic constipation    Degeneration of lumbar or lumbosacral intervertebral disc    External hemorrhoid, thrombosed    Gastritis, chronic    GERD (gastroesophageal reflux disease)    Hyperglycemia    Hyperlipidemia    Lupus anticoagulant positive    Olecranon bursitis    Osteopenia    Ovarian failure    Paresthesia    Vitamin D deficiency     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   Gastro-esophageal reflux disease without esophagitis 04/14/2021   Tortuous colon 04/14/2021   Osteoporosis 03/31/2021   History of fracture of left hip 10/14/2020   Osteoarthritis 10/22/2019   Chronic neck pain 04/05/2019   Osteoporosis, post-menopausal 01/03/2019   Scoliosis of lumbar spine  04/22/2016   Thoracic scoliosis 09/08/2015   Chondromalacia 03/10/2015   Hyperglycemia 12/24/2014   Cervical radiculitis 12/24/2014   Subclinical hypothyroidism 12/24/2014   Vitamin D deficiency 12/24/2014   Hx of Clostridium difficile infection 12/24/2014   External hemorrhoid 12/24/2014   Chronic gastric erosion 12/24/2014   Chondrocalcinosis of right knee 12/24/2014   DDD (degenerative disc disease), lumbar 12/24/2014    Past Surgical History:  Procedure Laterality Date   arthroscopic knee Right 05/26/2014   CHOLECYSTECTOMY  02/28/1989   DILATION AND CURETTAGE OF UTERUS     HIP PINNING,CANNULATED Left 08/07/2020   Procedure: Left hip percutaneous pinning;  Surgeon: Signa Kell, MD;  Location: [facility] ORS;  Service: Orthopedics;  Laterality: Left;   right ulna surgery Right 07/01/2010   TONSILLECTOMY AND ADENOIDECTOMY Bilateral 03/01/1959    OB History   No obstetric history on file.      Home Medications    Prior to Admission medications   Medication Sig Start Date End Date Taking? Authorizing Provider  azithromycin (ZITHROMAX) 250 MG tablet Take 1 tablet (250 mg total) by mouth daily. Take first 2 tablets together, then 1 every day until finished. 02/24/23  Yes Mickie Bail, NP  Cholecalciferol (VITAMIN D) 2000 units tablet Take 3,000 Units by mouth daily.   Yes [provider]  cyanocobalamin 1000 MCG tablet Take 1,000 mcg by mouth 3 (three) times a week.   Yes [provider]  Magnesium 200 MG TABS  Take 400 mg by mouth daily.   Yes [provider]  metoprolol succinate (TOPROL-XL) 25 MG 24 hr tablet Take 1 tablet by mouth daily. 07/11/22 07/11/23 Yes [provider]  Misc Natural Products (TURMERIC CURCUMIN) CAPS Take 2 capsules by mouth daily.   Yes [provider]    Family History Family History  Problem Relation Age of Onset   Osteoporosis Mother    Aneurysm Father     Social History Social History   Tobacco Use    Smoking status: Never   Smokeless tobacco: Never  Vaping Use   Vaping status: Never Used  Substance Use Topics   Alcohol use: No    Alcohol/week: 0.0 standard drinks of alcohol   Drug use: No     Allergies   Terazol 3 [terconazole], Augmentin [amoxicillin-pot clavulanate], and Macrobid [nitrofurantoin macrocrystal]   Review of Systems Review of Systems  Constitutional:  Negative for chills and fever.  HENT:  Positive for congestion, postnasal drip, rhinorrhea, sneezing and voice change. Negative for ear pain and sore throat.   Respiratory:  Positive for cough. Negative for shortness of breath.   Cardiovascular:  Negative for chest pain and palpitations.     Physical Exam Triage Vital Signs ED Triage Vitals  Encounter Vitals Group     BP 02/24/23 1326 128/72     Systolic BP Percentile --      Diastolic BP Percentile --      Pulse Rate 02/24/23 1326 67     Resp 02/24/23 1326 18     Temp 02/24/23 1326 98 F (36.7 C)     Temp src --      SpO2 02/24/23 1326 98 %     Weight --      Height --      Head Circumference --      Peak Flow --      Pain Score 02/24/23 1332 0     Pain Loc --      Pain Education --      Exclude from Growth Chart --    No data found.  Updated Vital Signs BP 128/72   Pulse 67   Temp 98 F (36.7 C)   Resp 18   SpO2 98%   Visual Acuity Right Eye Distance:   Left Eye Distance:   Bilateral Distance:    Right Eye Near:   Left Eye Near:    Bilateral Near:     Physical Exam Constitutional:      General: She is not in acute distress. HENT:     Right Ear: Tympanic membrane normal.     Left Ear: Tympanic membrane normal.     Nose: Rhinorrhea present.     Mouth/Throat:     Mouth: Mucous membranes are moist.     Pharynx: Oropharynx is clear.  Cardiovascular:     Rate and Rhythm: Normal rate and regular rhythm.     Heart sounds: Normal heart sounds.  Pulmonary:     Effort: Pulmonary effort is normal. No respiratory distress.      Breath sounds: Normal breath sounds.  Neurological:     Mental Status: She is alert.      UC Treatments / Results  Labs (all labs ordered are listed, but only abnormal results are displayed) Labs Reviewed - No data to display  EKG   Radiology No results found.  Procedures Procedures (including critical care time)  Medications Ordered in UC Medications - No data to display  Initial  Impression / Assessment and Plan / UC Course  I have reviewed the triage vital signs and the nursing notes.  Pertinent labs & imaging results that were available during my care of the patient were reviewed by me and considered in my medical decision making (see chart for details).    Acute upper respiratory infection, blood-tinged sputum.  Afebrile and vital signs are stable.  Lungs are clear and O2 sat is 98% on room air.  Patient declines chest x-ray today.  She is reluctant to take an antibiotic for her symptoms due to history of C. difficile when taking antibiotics twice in the past.  She requests a written prescription to take to her pharmacy if her symptoms are not improving.  Prescription for Zithromax provided today.  Instructed patient to follow-up with her PCP on Monday.  ED precautions given.  Education provided on acute upper respiratory infection and hemoptysis.  She agrees to plan of care.  Final Clinical Impressions(s) / UC Diagnoses   Final diagnoses:  Acute upper respiratory infection  Blood-tinged sputum     Discharge Instructions      Take the Zithromax as directed.  Follow up with your primary care provider on Monday.  Go to the emergency department if you have worsening symptoms.        ED Prescriptions     Medication Sig Dispense Auth. Provider   azithromycin (ZITHROMAX) 250 MG tablet Take 1 tablet (250 mg total) by mouth daily. Take first 2 tablets together, then 1 every day until finished. 6 tablet Mickie Bail, NP      PDMP not reviewed this encounter.    Mickie Bail, NP 02/24/23 [phone]

## 2023-02-24 NOTE — ED Triage Notes (Signed)
Hoarse, congestion, sneezing x 3 days, cough with blood tinged/yellow mucus that started today. Taking mucinex.

## 2023-02-24 NOTE — Discharge Instructions (Addendum)
Take the Zithromax as directed.  Follow up with your primary care provider on Monday.  Go to the emergency department if you have worsening symptoms.

## 2023-03-06 ENCOUNTER — Telehealth: Payer: Self-pay | Admitting: Family Medicine

## 2023-03-06 NOTE — Telephone Encounter (Signed)
 Copied from CRM [id number]. Topic: Appointments - Medicare AWV >> Mar 03, 2023  4:18 PM Priscille Loveless wrote: Reason for CRM: Note in system that appt needs to be reschuled but it puts appt out until October and pt doesn't want to wait that long.

## 2023-03-06 NOTE — Telephone Encounter (Signed)
 I spoke with patient and rescheduled AWV to 08/21/2023 at 10:40.

## 2023-03-13 DIAGNOSIS — Z1231 Encounter for screening mammogram for malignant neoplasm of breast: Secondary | ICD-10-CM | POA: Diagnosis not present

## 2023-03-13 LAB — HM MAMMOGRAPHY

## 2023-03-20 DIAGNOSIS — H0011 Chalazion right upper eyelid: Secondary | ICD-10-CM | POA: Diagnosis not present

## 2023-03-22 IMAGING — RF DG HIP (WITH PELVIS) OPERATIVE*L*
1 series · 2 of 2 positions shown · non-contrast
Comparison: Left hip radiographs-08/04/2020

CLINICAL DATA: Left hip pinning

EXAM:
OPERATIVE LEFT HIP (WITH PELVIS IF PERFORMED) 2 VIEWS
TECHNIQUE: Fluoroscopic spot image(s) were submitted for interpretation
post-operatively.

[Series 1: run · 2 of 2 slices shown]
[im 1/2]
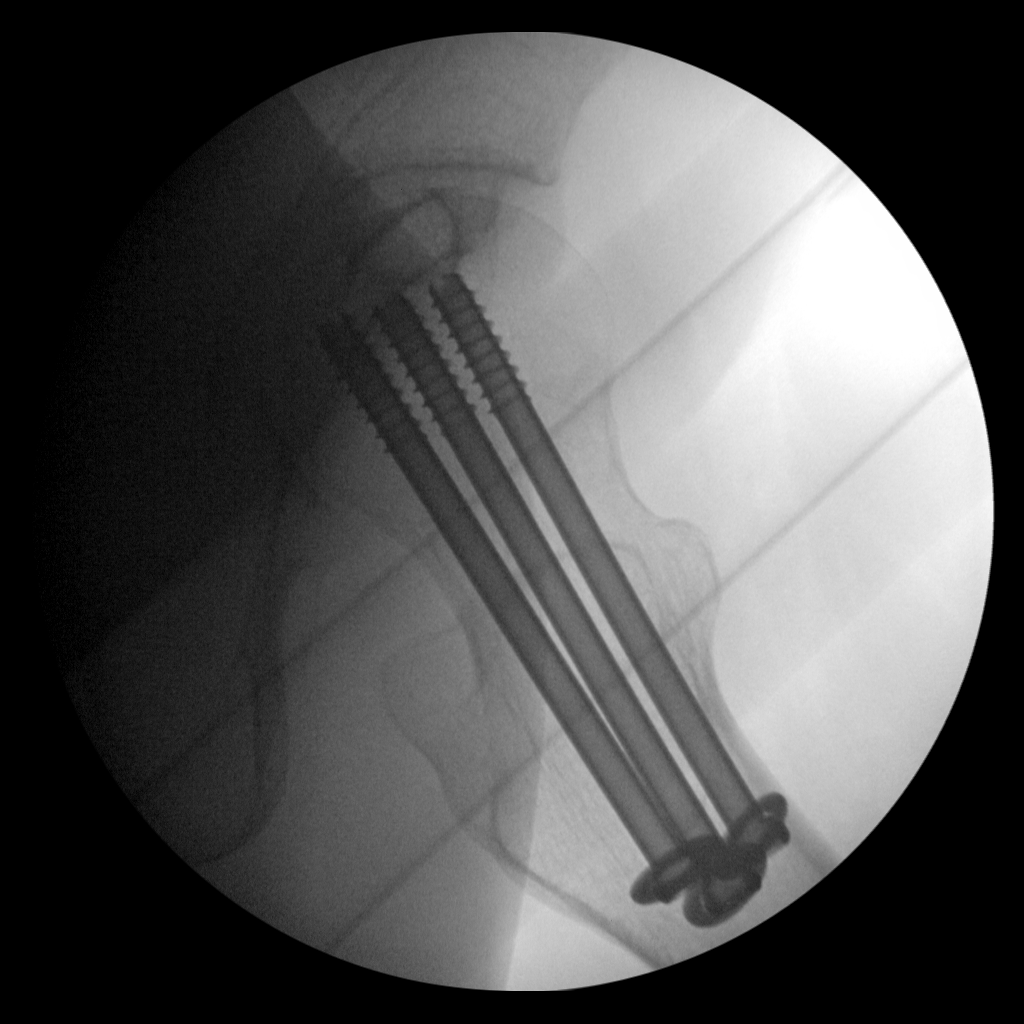
[im 2/2]
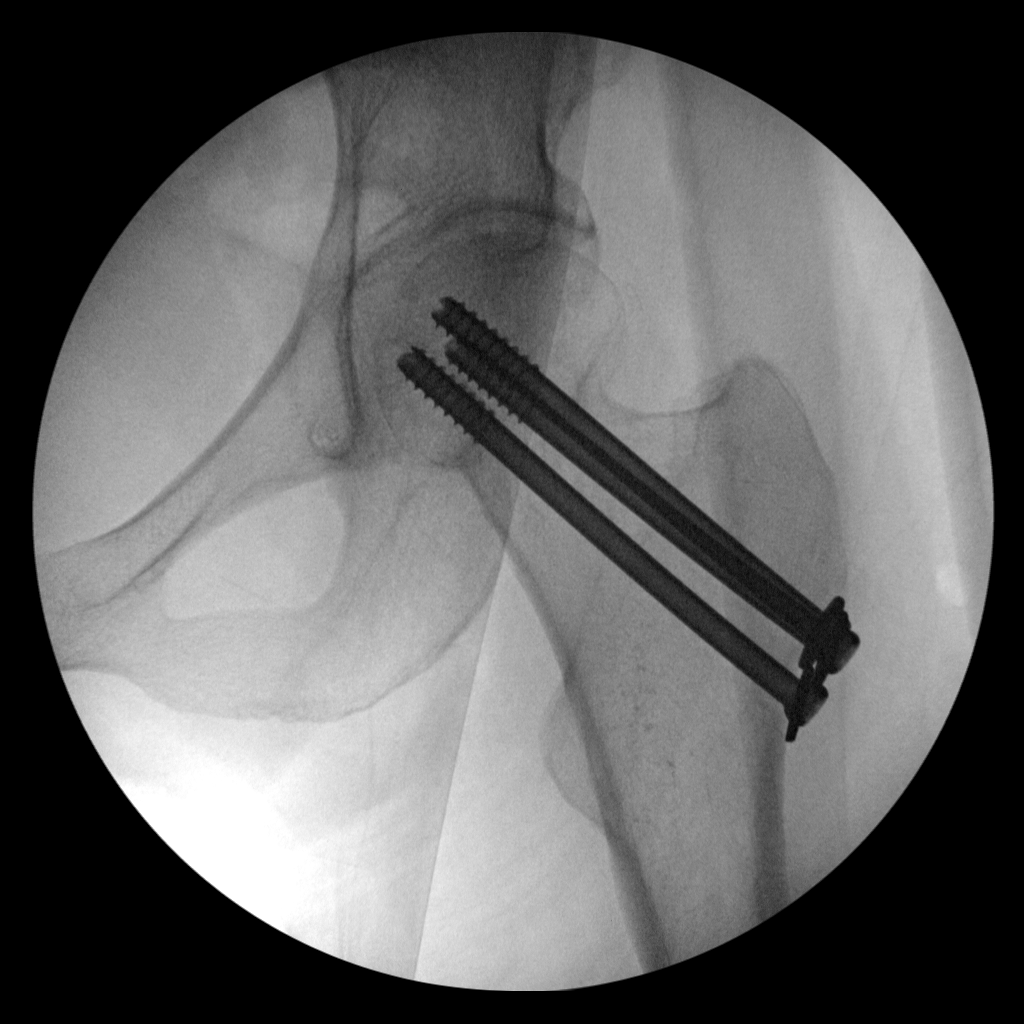

[2 of 2 positions shown; findings below may reference images not displayed]

FINDINGS: Two spot intraoperative fluoroscopic images of the left hip are
provided and demonstrate the sequela of cancellous screw fixation of
the left femoral neck with improved alignment of previously noted
subcapital femoral neck fracture. Alignment appears near anatomic.
No radiopaque foreign body.
IMPRESSION: Post cancellous screw fixation subcapital left femoral neck
fracture.

## 2023-05-02 DIAGNOSIS — D689 Coagulation defect, unspecified: Secondary | ICD-10-CM | POA: Insufficient documentation

## 2023-05-09 ENCOUNTER — Ambulatory Visit: Payer: Self-pay | Admitting: Family Medicine

## 2023-06-06 ENCOUNTER — Ambulatory Visit: Admitting: Family Medicine

## 2023-06-06 ENCOUNTER — Encounter: Payer: Self-pay | Admitting: Family Medicine

## 2023-06-06 VITALS — BP 120/70 | HR 84 | Resp 16 | Ht 64.0 in | Wt 133.4 lb

## 2023-06-06 DIAGNOSIS — E038 Other specified hypothyroidism: Secondary | ICD-10-CM

## 2023-06-06 DIAGNOSIS — R7303 Prediabetes: Secondary | ICD-10-CM

## 2023-06-06 DIAGNOSIS — M810 Age-related osteoporosis without current pathological fracture: Secondary | ICD-10-CM | POA: Diagnosis not present

## 2023-06-06 DIAGNOSIS — Z79899 Other long term (current) drug therapy: Secondary | ICD-10-CM

## 2023-06-06 DIAGNOSIS — I428 Other cardiomyopathies: Secondary | ICD-10-CM | POA: Diagnosis not present

## 2023-06-06 DIAGNOSIS — Q438 Other specified congenital malformations of intestine: Secondary | ICD-10-CM

## 2023-06-06 DIAGNOSIS — I700 Atherosclerosis of aorta: Secondary | ICD-10-CM

## 2023-06-06 DIAGNOSIS — R739 Hyperglycemia, unspecified: Secondary | ICD-10-CM

## 2023-06-06 DIAGNOSIS — Z8719 Personal history of other diseases of the digestive system: Secondary | ICD-10-CM

## 2023-06-06 DIAGNOSIS — E538 Deficiency of other specified B group vitamins: Secondary | ICD-10-CM | POA: Diagnosis not present

## 2023-06-06 DIAGNOSIS — Q433 Congenital malformations of intestinal fixation: Secondary | ICD-10-CM

## 2023-06-06 LAB — LIPID PANEL
Cholesterol: 213 mg/dL — ABNORMAL HIGH (ref <200)
HDL: 70 mg/dL (ref >=50)
LDL Cholesterol (Calc): 125 mg/dL — ABNORMAL HIGH
Non-HDL Cholesterol (Calc): 143 mg/dL — ABNORMAL HIGH (ref <130)
Total CHOL/HDL Ratio: 3 (calc) (ref <5.0)
Triglycerides: 81 mg/dL (ref <150)

## 2023-06-06 LAB — COMPREHENSIVE METABOLIC PANEL WITH GFR
AG Ratio: 1.9 (calc) (ref 1.0–2.5)
ALT: 20 U/L (ref 6–29)
AST: 27 U/L (ref 10–35)
Albumin: 4.1 g/dL (ref 3.6–5.1)
Alkaline phosphatase (APISO): 57 U/L (ref 37–153)
BUN: 15 mg/dL (ref 7–25)
CO2: 31 mmol/L (ref 20–32)
Calcium: 9.4 mg/dL (ref 8.6–10.4)
Chloride: 105 mmol/L (ref 98–110)
Creat: 0.64 mg/dL (ref 0.60–1.00)
Globulin: 2.2 g/dL (ref 1.9–3.7)
Glucose, Bld: 88 mg/dL (ref 65–99)
Potassium: 4.4 mmol/L (ref 3.5–5.3)
Sodium: 140 mmol/L (ref 135–146)
Total Bilirubin: 0.5 mg/dL (ref 0.2–1.2)
Total Protein: 6.3 g/dL (ref 6.1–8.1)
eGFR: 95 mL/min/{1.73_m2} (ref >=60)

## 2023-06-06 LAB — CBC WITH DIFFERENTIAL/PLATELET
Absolute Lymphocytes: 1958 {cells}/uL (ref 850–3900)
Absolute Monocytes: 537 {cells}/uL (ref 200–950)
Basophils Absolute: 43 {cells}/uL (ref 0–200)
Basophils Relative: 0.7 %
Eosinophils Absolute: 104 {cells}/uL (ref 15–500)
Eosinophils Relative: 1.7 %
HCT: 39.7 % (ref 35.0–45.0)
Hemoglobin: 13 g/dL (ref 11.7–15.5)
MCH: 30.9 pg (ref 27.0–33.0)
MCHC: 32.7 g/dL (ref 32.0–36.0)
MCV: 94.3 fL (ref 80.0–100.0)
MPV: 11.5 fL (ref 7.5–12.5)
Monocytes Relative: 8.8 %
Neutro Abs: 3459 {cells}/uL (ref 1500–7800)
Neutrophils Relative %: 56.7 %
Platelets: 219 10*3/uL (ref 140–400)
RBC: 4.21 10*6/uL (ref 3.80–5.10)
RDW: 12.3 % (ref 11.0–15.0)
Total Lymphocyte: 32.1 %
WBC: 6.1 10*3/uL (ref 3.8–10.8)

## 2023-06-06 LAB — B12 AND FOLATE PANEL
Folate: 21.1 ng/mL
Vitamin B-12: 564 pg/mL (ref 200–1100)

## 2023-06-06 LAB — TSH+FREE T4: TSH W/REFLEX TO FT4: 2.72 m[IU]/L (ref 0.40–4.50)

## 2023-06-06 LAB — HEMOGLOBIN A1C
Hgb A1c MFr Bld: 6 %{Hb} — ABNORMAL HIGH (ref <5.7)
Mean Plasma Glucose: 126 mg/dL
eAG (mmol/L): 7 mmol/L

## 2023-06-06 NOTE — Progress Notes (Signed)
 Name: Dana Arnold   MRN: [id number]    DOB: 07-May-1950   Date:06/06/2023       Progress Note  Subjective  Chief Complaint  Chief Complaint  Patient presents with   Medical Management of Chronic Issues   Discussed the use of AI scribe software for clinical note transcription with the patient, who gave verbal consent to proceed.  History of Present Illness Dana Arnold is a 73 year old female with chronic idiopathic constipation who presents for a regular follow-up visit.  She has chronic idiopathic constipation, currently managed with magnesium citrate 100 to 200 mg twice a day. She discontinued MiraLAX in February due to improved bowel movements and has not experienced any impaction since. Her bowel movements are more solid, described as 'like a banana'. 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   She has a history of esophageal dilation, having undergone the procedure twice, with the last dilation in June 2024. No current issues with swallowing or gastritis, which was last noted in 2018.  She experiences episodes of hypoglycemia, particularly one to two hours after eating, despite dietary changes to reduce carbohydrate intake. She is scheduled to see an endocrinologist for further evaluation tomorrow.  She has subclinical hypothyroidism, with her last TSH level being normal. She reports thinning hair at the crown of her head, which her husband noticed. Denies other symptoms   She has non-ischemic cardiomyopathy with mild systolic dysfunction and paradoxical septal motion due to left bundle branch block. Her ejection fraction improved from 42% to 53% after starting metoprolol 25 mg. She is under the care of cardiologist   She has osteoporosis, particularly noted in the right hip, and is not currently on medication for this condition. She plans to discuss this with her endocrinologist. Not a  candidate for oral biphosphonates due to history of esophageal stricture  She has high cholesterol and atherosclerosis of the aorta but is not currently on medication for these conditions. She is monitoring her cholesterol levels regularly.  She reports a foot issue with a protruding bone and soreness, diagnosed as a bunion and possible tendon issue. She wears a spacer between her toes to manage the condition.    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   Gastro-esophageal reflux disease without esophagitis 04/14/2021   Tortuous colon 04/14/2021   Osteoporosis 03/31/2021   History of fracture of left hip 10/14/2020   Osteoarthritis 10/22/2019   Chronic neck pain 04/05/2019   Osteoporosis, post-menopausal 01/03/2019   Scoliosis of lumbar spine 04/22/2016   Thoracic scoliosis 09/08/2015   Chondromalacia 03/10/2015   Hyperglycemia 12/24/2014   Cervical radiculitis 12/24/2014   Subclinical hypothyroidism 12/24/2014   Vitamin D deficiency 12/24/2014   Hx of Clostridium difficile infection 12/24/2014   External hemorrhoid 12/24/2014   Chronic gastric erosion 12/24/2014   Chondrocalcinosis of right knee 12/24/2014   DDD (degenerative disc disease), lumbar 12/24/2014    Past Surgical History:  Procedure Laterality Date   arthroscopic knee Right 05/26/2014   CHOLECYSTECTOMY  02/28/1989   DILATION AND CURETTAGE OF UTERUS     HIP PINNING,CANNULATED Left 08/07/2020   Procedure: Left hip percutaneous pinning;  Surgeon: Signa Kell, MD;  Location: [facility] ORS;  Service: Orthopedics;  Laterality: Left;   right ulna surgery Right 07/01/2010   TONSILLECTOMY AND ADENOIDECTOMY Bilateral 03/01/1959    Family History  Problem Relation Age of Onset   Osteoporosis Mother    Aneurysm Father     Social History   Tobacco Use   Smoking status: Never   Smokeless tobacco: Never  Substance Use Topics   Alcohol use: No    Alcohol/week: 0.0 standard drinks of alcohol     Current Outpatient Medications:    Cholecalciferol (VITAMIN D) 2000 units tablet, Take 3,000 Units by mouth daily., Disp: , Rfl:    cyanocobalamin 1000 MCG tablet, Take 1,000 mcg by mouth 3 (three) times a week., Disp: , Rfl:    Magnesium 200 MG TABS, Take 400 mg by mouth daily., Disp: , Rfl:    metoprolol succinate (TOPROL-XL) 25 MG 24 hr tablet, Take 1 tablet by mouth daily., Disp: , Rfl:    azithromycin (ZITHROMAX) 250 MG tablet, Take 1 tablet (250 mg total) by mouth daily. Take first 2 tablets together, then 1 every day until finished., Disp: 6 tablet, Rfl: 0   Misc Natural Products (TURMERIC CURCUMIN) CAPS, Take 2 capsules by mouth daily., Disp: , Rfl:   Allergies  Allergen Reactions   Terazol 3 [Terconazole] Swelling and Rash   Augmentin [Amoxicillin-Pot Clavulanate] Itching    Hands    Macrobid [Nitrofurantoin Macrocrystal]     I personally reviewed active problem list, medication list, allergies, family history with the patient/caregiver today.   ROS  Ten systems reviewed and is negative except as mentioned in HPI    Objective Physical Exam CONSTITUTIONAL: Patient appears well-developed and well-nourished. No distress. HEENT: Head atraumatic, normocephalic, neck supple. CARDIOVASCULAR: Normal rate, regular rhythm and normal heart sounds. No murmur heard. No BLE edema. PULMONARY: Effort normal and breath sounds normal. No respiratory distress. ABDOMINAL: There is no tenderness or distention. MUSCULOSKELETAL: Normal gait. Without  gross motor or sensory deficit. Calcification on tendon of foot. Bunion on foot. PSYCHIATRIC: Patient has a normal mood and affect. Behavior is normal. Judgment and thought content normal.  Vitals:   06/06/23 0938  BP: 120/70  Pulse: 84  Resp: 16  SpO2: 98%  Weight: 133 lb 6.4 oz (60.5 kg)  Height: 5\' 4"  (1.626 m)    Body mass index is 22.9 kg/m.  No results found for this or any previous visit (from the past 2160 hours).  Diabetic Foot Exam:     PHQ2/9:    06/06/2023    9:32 AM 01/23/2023    8:41 AM 01/11/2023   11:17 AM 10/20/2022    7:55 AM 08/12/2022   10:50 AM  Depression screen PHQ 2/9  Decreased Interest 0 0 0 0 0  Down, Depressed, Hopeless 0 0 0 0 0  PHQ - 2 Score 0 0 0 0 0  Altered sleeping 0   0   Tired, decreased energy 0   0   Change in appetite 0   0   Feeling bad or failure about yourself  0   0   Trouble concentrating 0   0   Moving slowly or fidgety/restless 0   0   Suicidal thoughts 0   0   PHQ-9 Score 0   0   Difficult doing work/chores Not difficult at  all        phq 9 is negative  Fall Risk:    06/06/2023    9:30 AM 01/23/2023    8:41 AM 01/11/2023   11:17 AM 10/20/2022    7:55 AM 08/12/2022   10:48 AM  Fall Risk   Falls in the past year? 0 0 0 0 0  Number falls in past yr: 0 0 0 0 0  Injury with Fall? 0 0 0 0 0  Risk for fall due to : No Fall Risks No Fall Risks No Fall Risks No Fall Risks No Fall Risks  Follow up Falls prevention discussed;Education provided;Falls evaluation completed  Falls prevention discussed Falls prevention discussed Education provided;Falls prevention discussed     Assessment and Plan Assessment & Plan Chronic Idiopathic Constipation Improved with magnesium citrate. Recommended small dose of MiraLAX to prevent recurrence. - Continue magnesium citrate 100-200 mg twice a day. - Resume MiraLAX at half cap to prevent constipation.  Esophageal Stricture Well-managed with no swallowing difficulties. Avoided oral  bisphosphonates due to risk. - Avoid oral bisphosphonates.  Hypoglycemia Postprandial episodes despite dietary changes. A1c indicates prediabetes. Endocrinology evaluation pending. - Perform A1c test. - Attend endocrinology appointment for further evaluation.  Non-Ischemic Cardiomyopathy Mild systolic dysfunction with improved ejection fraction from 42% to 53% on metoprolol. - Continue metoprolol 25 mg. - Follow up with cardiologist as scheduled.  Osteoporosis Untreated due to esophageal stricture. Discuss alternative treatments with endocrinologist. - Discuss osteoporosis treatment options with endocrinologist. - Consider Prolia or Reclast as alternatives to oral bisphosphonates.  Subclinical Hypothyroidism Previous slightly elevated TSH, now normal. Hair thinning possibly related. Monitor thyroid function and symptoms. - Check TSH and T4 levels. - Monitor for symptoms of hypothyroidism.  General Health Maintenance Regular check-ups and monitoring. Takes vitamin D and B12 supplements. Routine blood work planned. - Check B12 and folate levels. - Continue vitamin D supplementation. - Perform cholesterol test.  Follow-up Scheduled for management of health conditions. - Attend endocrinology appointment on June 07, 2023. - Follow up with cardiologist in May. - Attend gastroenterology appointment in May.

## 2023-06-07 ENCOUNTER — Encounter: Payer: Self-pay | Admitting: Family Medicine

## 2023-06-13 ENCOUNTER — Encounter: Payer: Self-pay | Admitting: Family Medicine

## 2023-06-13 ENCOUNTER — Other Ambulatory Visit: Payer: Self-pay

## 2023-06-13 DIAGNOSIS — Z13820 Encounter for screening for osteoporosis: Secondary | ICD-10-CM

## 2023-07-26 ENCOUNTER — Ambulatory Visit: Payer: Self-pay

## 2023-07-26 NOTE — Telephone Encounter (Signed)
  Chief Complaint: bilateral facial numbness Symptoms: mouth, left and right side of face numbness (last seconds) Frequency: x 1 week, intermittent Pertinent Negatives: Patient denies chest pain, difficulty breathing, palpitations, changes in vision or speech, facial droop, dizziness, unsteady gait. Disposition: [] ED /[] Urgent Care (no appt availability in office) / [x] Appointment(In office/virtual)/ []  [facility]/ [] Home Care/ [] Refused Recommended Disposition /[] [facility]/ []  Follow-up with PCP Additional Notes: She states the most recent episode was this afternoon when driving, she lifted her right arm and then began feeling right sided facial/jaw numbness. It lasted less than a minute and resolved. She states she had an episode recently where it started with her lower lip numbness and then when to her top lip. She states back in January she had a tooth abscess that involved her bridge and it had to be removed. She is not sure if that is related or if this could be from her degenerative disc disease in her neck. She states her pulse was 66 and BP was 121/63 today. Patient verbalizes understanding of stroke signs and symptoms and to call 911 immediately if she develops any.  Copied from CRM [id number]. Topic: Clinical - Red Word Triage >> Jul 26, 2023  2:56 PM Elle L wrote: Red Word that prompted transfer to Nurse Triage: The patient has been having numbness in her face that has been coming and going. Reason for Disposition  [1] Numbness or tingling on both sides of body AND [2] is a new symptom present > 24 hours  Answer Assessment - Initial Assessment Questions 1. SYMPTOM: "What is the main symptom you are concerned about?" (e.g., weakness, numbness)     Numbness on both sides of face, last night it was bottom and upper lip.  2. ONSET: "When did this start?" (minutes, hours, days; while sleeping)     X 1 week.  3. LAST NORMAL: "When was the last time you (the patient)  were normal (no symptoms)?"     Over a week ago.  4. PATTERN "Does this come and go, or has it been constant since it started?"  "Is it present now?"     Comes and goes, not present at this time.   5. CARDIAC SYMPTOMS: "Have you had any of the following symptoms: chest pain, difficulty breathing, palpitations?"     No.  6. NEUROLOGIC SYMPTOMS: "Have you had any of the following symptoms: headache, dizziness, vision loss, double vision, changes in speech, unsteady on your feet?"     No.  7. OTHER SYMPTOMS: "Do you have any other symptoms?"     Headache (intermittent, 2 weeks ago), neck pain.  8. PREGNANCY: "Is there any chance you are pregnant?" "When was your last menstrual period?"     N/A.  Protocols used: Neurologic Deficit-A-AH

## 2023-07-27 ENCOUNTER — Encounter: Payer: Self-pay | Admitting: Family Medicine

## 2023-07-27 ENCOUNTER — Ambulatory Visit: Admitting: Family Medicine

## 2023-07-27 VITALS — BP 122/66 | HR 79 | Resp 16 | Ht 64.0 in | Wt 132.8 lb

## 2023-07-27 DIAGNOSIS — S098XXA Other specified injuries of head, initial encounter: Secondary | ICD-10-CM | POA: Diagnosis not present

## 2023-07-27 DIAGNOSIS — R202 Paresthesia of skin: Secondary | ICD-10-CM | POA: Diagnosis not present

## 2023-07-27 DIAGNOSIS — Z8249 Family history of ischemic heart disease and other diseases of the circulatory system: Secondary | ICD-10-CM

## 2023-07-27 NOTE — Progress Notes (Signed)
 Name: Dana Arnold   MRN: [id number]    DOB: Jul 13, 1950   Date:07/27/2023       Progress Note  Subjective  Chief Complaint  Chief Complaint  Patient presents with   Numbness    Bilateral sides intermittent over the past week   Neck Pain   Discussed the use of AI scribe software for clinical note transcription with the patient, who gave verbal consent to proceed.  History of Present Illness Dana Arnold is a 73 year old female who presents with episodes of facial numbness. She is accompanied by her daughter, who is a Doctor, general practice.  She has been experiencing episodes of facial numbness for approximately one week. The initial episode occurred while eating, with numbness inside her lower lip and a small area on the right side of her face, lasting about 15 seconds. Subsequent episodes have involved numbness along the right jaw and, on another occasion, on the left side of her face. These episodes are transient and resolve quickly.  She has a history of dental issues, including an abscessed tooth on the right lower side, which underwent two root canals and was anchoring a bridge. In January, she was treated with antibiotics due to concerns about C. difficile, which she has had twice in the past. The bridge was eventually removed, and the tooth was extracted without complications. She later sought a second opinion from a holistic biological dentist who used platelet-rich plasma and bone grafting to aid healing.  Approximately two weeks ago, she experienced minor head trauma when she hit the back of her head on a hard surface. She did not lose consciousness, experience dizziness, or have a headache at the time, and she reports a high pain tolerance.  Her family history is significant for brain aneurysms; her father died from a brain aneurysm at age 70, and her maternal grandmother survived two cerebral hemorrhages.  No weakness, confusion, speech problems, double vision, or stroke-like  symptoms. She mentions occasional mild headaches associated with nasal congestion and left ear blockage, but these headaches are not severe and resolve quickly.    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   Gastro-esophageal reflux disease without esophagitis 04/14/2021   Tortuous colon 04/14/2021   History of fracture of left hip 10/14/2020   Osteoarthritis 10/22/2019   Chronic neck pain 04/05/2019   Osteoporosis, post-menopausal 01/03/2019   Scoliosis of lumbar spine 04/22/2016   Thoracic scoliosis 09/08/2015   Chondromalacia 03/10/2015   Hyperglycemia 12/24/2014   Cervical radiculitis 12/24/2014   Subclinical hypothyroidism 12/24/2014   Vitamin D  deficiency 12/24/2014   External hemorrhoid 12/24/2014   Chondrocalcinosis of right knee 12/24/2014   DDD (degenerative disc disease), lumbar 12/24/2014    Past Surgical History:  Procedure Laterality Date   arthroscopic knee Right 05/26/2014   CHOLECYSTECTOMY  02/28/1989   DILATION AND CURETTAGE OF UTERUS     HIP PINNING,CANNULATED Left 08/07/2020   Procedure: Left hip percutaneous pinning;  Surgeon: Lorri Rota, MD;  Location: [facility] ORS;  Service: Orthopedics;  Laterality: Left;   right ulna surgery Right 07/01/2010   TONSILLECTOMY AND ADENOIDECTOMY Bilateral 03/01/1959  Family History  Problem Relation Age of Onset   Osteoporosis Mother    Aneurysm Father     Social History   Tobacco Use   Smoking status: Never   Smokeless tobacco: Never  Substance Use Topics   Alcohol use: No    Alcohol/week: 0.0  standard drinks of alcohol     Current Outpatient Medications:    cyanocobalamin  1000 MCG tablet, Take 1,000 mcg by mouth 3 (three) times a week., Disp: , Rfl:    Magnesium 200 MG TABS, Take 400 mg by mouth daily., Disp: , Rfl:    OVER THE COUNTER MEDICATION, MULTIVIT-MINERALS/FOLIC ACID  (CENTRUM ADULT 50 PLUS ORAL), Disp: , Rfl:    Cholecalciferol (VITAMIN D ) 2000 units tablet, Take 3,000 Units by mouth daily. (Patient not taking: Reported on 07/27/2023), Disp: , Rfl:    metoprolol succinate (TOPROL-XL) 25 MG 24 hr tablet, Take 1 tablet by mouth daily., Disp: , Rfl:   Allergies  Allergen Reactions   Terazol 3 [Terconazole] Swelling and Rash   Augmentin [Amoxicillin-Pot Clavulanate] Itching    Hands    Macrobid [Nitrofurantoin Macrocrystal]     I personally reviewed active problem list, medication list, allergies with the patient/caregiver today.   ROS  Ten systems reviewed and is negative except as mentioned in HPI    Objective Physical Exam CONSTITUTIONAL: Patient appears well-developed and well-nourished. No distress. HEENT: Head atraumatic, normocephalic, neck supple. CARDIOVASCULAR: Normal rate, regular rhythm and normal heart sounds. No murmur heard. No BLE edema. PULMONARY: Effort normal and breath sounds normal. No respiratory distress. ABDOMINAL: There is no tenderness or distention. MUSCULOSKELETAL: Normal gait. Without gross motor or sensory deficit. PSYCHIATRIC: Patient has a normal mood and affect. Behavior is normal. Judgment and thought content normal. NEUROLOGICAL: Cranial nerves II-XII grossly intact. Romberg test negative. Sensation intact in face, arms, and legs.  Vitals:   07/27/23 0820  BP: 122/66  Pulse: 79  Resp: 16  SpO2: 100%  Weight: 132 lb 12.8 oz (60.2 kg)  Height: 5\' 4"  (1.626 m)    Body mass index is 22.8 kg/m.  Recent Results (from the past 2160 hours)  Lipid panel     Status: Abnormal   Collection Time: 06/06/23 10:12 AM  Result  Value Ref Range   Cholesterol 213 (H) <200 mg/dL   HDL 70 > OR = 50 mg/dL   Triglycerides 81 <981 mg/dL   LDL Cholesterol (Calc) 125 (H) mg/dL (calc)    Comment: Reference range: <100 . Desirable range <100 mg/dL for primary prevention;   <70 mg/dL for patients with CHD or diabetic patients  with > or = 2 CHD risk factors. Aaron Aas LDL-C is now calculated using the Martin-Hopkins  calculation, which is a validated novel method providing  better accuracy than the Friedewald equation in the  estimation of LDL-C.  Melinda Sprawls et al. Erroll Heard. 1914;782(95): 2061-2068  (http://education.[facility].com/faq/FAQ164)    Total CHOL/HDL Ratio 3.0 <5.0 (calc)   Non-HDL Cholesterol (Calc) 143 (H) <130 mg/dL (calc)    Comment: For patients with diabetes plus 1 major ASCVD risk  factor, treating to a non-HDL-C goal of <100 mg/dL  (LDL-C of <62 mg/dL) is considered a therapeutic  option.   CBC with Differential/Platelet     Status: None   Collection Time: 06/06/23 10:12 AM  Result Value Ref Range   WBC 6.1 3.8 - 10.8 Thousand/uL   RBC 4.21 3.80 - 5.10 Million/uL   Hemoglobin 13.0 11.7 - 15.5 g/dL   HCT 13.0 86.5 - 78.4 %  MCV 94.3 80.0 - 100.0 fL   MCH 30.9 27.0 - 33.0 pg   MCHC 32.7 32.0 - 36.0 g/dL    Comment: For adults, a slight decrease in the calculated MCHC value (in the range of 30 to 32 g/dL) is most likely not clinically significant; however, it should be interpreted with caution in correlation with other red cell parameters and the patient's clinical condition.    RDW 12.3 11.0 - 15.0 %   Platelets 219 140 - 400 Thousand/uL   MPV 11.5 7.5 - 12.5 fL   Neutro Abs 3,459 1,500 - 7,800 cells/uL   Absolute Lymphocytes 1,958 850 - 3,900 cells/uL   Absolute Monocytes 537 200 - 950 cells/uL   Eosinophils Absolute 104 15 - 500 cells/uL   Basophils Absolute 43 0 - 200 cells/uL   Neutrophils Relative % 56.7 %   Total Lymphocyte 32.1 %   Monocytes Relative 8.8 %   Eosinophils Relative  1.7 %   Basophils Relative 0.7 %  Comprehensive metabolic panel with GFR     Status: None   Collection Time: 06/06/23 10:12 AM  Result Value Ref Range   Glucose, Bld 88 65 - 99 mg/dL    Comment: .            Fasting reference interval .    BUN 15 7 - 25 mg/dL   Creat 9.14 7.82 - 9.56 mg/dL   eGFR 95 > OR = 60 OZ/HYQ/6.57Q4   BUN/Creatinine Ratio SEE NOTE: 6 - 22 (calc)    Comment:    Not Reported: BUN and Creatinine are within    reference range. .    Sodium 140 135 - 146 mmol/L   Potassium 4.4 3.5 - 5.3 mmol/L   Chloride 105 98 - 110 mmol/L   CO2 31 20 - 32 mmol/L   Calcium  9.4 8.6 - 10.4 mg/dL   Total Protein 6.3 6.1 - 8.1 g/dL   Albumin 4.1 3.6 - 5.1 g/dL   Globulin 2.2 1.9 - 3.7 g/dL (calc)   AG Ratio 1.9 1.0 - 2.5 (calc)   Total Bilirubin 0.5 0.2 - 1.2 mg/dL   Alkaline phosphatase (APISO) 57 37 - 153 U/L   AST 27 10 - 35 U/L   ALT 20 6 - 29 U/L  Hemoglobin A1c     Status: Abnormal   Collection Time: 06/06/23 10:12 AM  Result Value Ref Range   Hgb A1c MFr Bld 6.0 (H) <5.7 % of total Hgb    Comment: For someone without known diabetes, a hemoglobin  A1c value between 5.7% and 6.4% is consistent with prediabetes and should be confirmed with a  follow-up test. . For someone with known diabetes, a value <7% indicates that their diabetes is well controlled. A1c targets should be individualized based on duration of diabetes, age, comorbid conditions, and other considerations. . This assay result is consistent with an increased risk of diabetes. . Currently, no consensus exists regarding use of hemoglobin A1c for diagnosis of diabetes for children. .    Mean Plasma Glucose 126 mg/dL   eAG (mmol/L) 7.0 mmol/L  TSH + free T4     Status: None   Collection Time: 06/06/23 10:12 AM  Result Value Ref Range   TSH W/REFLEX TO FT4 2.72 0.40 - 4.50 mIU/L  B12 and Folate Panel     Status: None   Collection Time: 06/06/23 10:12 AM  Result Value Ref Range   Vitamin B-12  564 200 - 1,100 pg/mL  Folate 21.1 ng/mL    Comment:                            Reference Range                            Low:           <3.4                            Borderline:    3.4-5.4                            Normal:        >5.4 .     Diabetic Foot Exam:     PHQ2/9:    07/27/2023    8:20 AM 06/06/2023    9:32 AM 01/23/2023    8:41 AM 01/11/2023   11:17 AM 10/20/2022    7:55 AM  Depression screen PHQ 2/9  Decreased Interest 0 0 0 0 0  Down, Depressed, Hopeless 0 0 0 0 0  PHQ - 2 Score 0 0 0 0 0  Altered sleeping 0 0   0  Tired, decreased energy 0 0   0  Change in appetite 0 0   0  Feeling bad or failure about yourself  0 0   0  Trouble concentrating 0 0   0  Moving slowly or fidgety/restless 0 0   0  Suicidal thoughts 0 0   0  PHQ-9 Score 0 0   0  Difficult doing work/chores Not difficult at all Not difficult at all       phq 9 is negative  Fall Risk:    07/27/2023    8:15 AM 06/06/2023    9:30 AM 01/23/2023    8:41 AM 01/11/2023   11:17 AM 10/20/2022    7:55 AM  Fall Risk   Falls in the past year? 0 0 0 0 0  Number falls in past yr: 0 0 0 0 0  Injury with Fall? 0 0 0 0 0  Risk for fall due to : No Fall Risks No Fall Risks No Fall Risks No Fall Risks No Fall Risks  Follow up Falls prevention discussed;Education provided;Falls evaluation completed Falls prevention discussed;Education provided;Falls evaluation completed  Falls prevention discussed Falls prevention discussed      Assessment & Plan Facial numbness Intermittent bilateral lower lip and jaw numbness. Differential includes multiple sclerosis and stroke. Family history of brain aneurysm and cerebral hemorrhages. Recent mild head trauma noted. - Order MRI brain with and without contrast. - Advised to call 911 if symptoms worsen or new symptoms like bowel or bladder incontinence, visual disturbances, or generalized numbness occur.  Mild head trauma Occipital region trauma two weeks ago. No loss  of consciousness, dizziness, or persistent headache. Considered mild due to age and recent facial numbness. - Monitor for new or worsening symptoms.

## 2023-07-28 ENCOUNTER — Telehealth: Payer: Self-pay | Admitting: Family Medicine

## 2023-07-28 NOTE — Telephone Encounter (Signed)
 Copied from CRM [id number]. Topic: Appointments - Scheduling Inquiry for Clinic >> Jul 28, 2023  9:16 AM Kita Perish H wrote: Reason for CRM: Patient is calling to schedule her MRI, referral in system.  [facility] [place] [phone]

## 2023-07-29 ENCOUNTER — Ambulatory Visit
Admission: RE | Admit: 2023-07-29 | Discharge: 2023-07-29 | Disposition: A | Discharge status: Home / Self Care | Source: Ambulatory Visit | Attending: Family Medicine | Admitting: Family Medicine

## 2023-07-29 DIAGNOSIS — Z8249 Family history of ischemic heart disease and other diseases of the circulatory system: Secondary | ICD-10-CM | POA: Diagnosis present

## 2023-07-29 DIAGNOSIS — S098XXA Other specified injuries of head, initial encounter: Secondary | ICD-10-CM | POA: Diagnosis present

## 2023-07-29 DIAGNOSIS — R202 Paresthesia of skin: Secondary | ICD-10-CM

## 2023-07-29 MED ORDER — GADOBUTROL 1 MMOL/ML IV SOLN
6.0000 mL | Freq: Once | INTRAVENOUS | Status: AC | PRN
  Administered 2023-07-29: 6 mL via INTRAVENOUS

## 2023-07-31 ENCOUNTER — Other Ambulatory Visit

## 2023-08-03 ENCOUNTER — Ambulatory Visit
Admission: RE | Admit: 2023-08-03 | Discharge: 2023-08-03 | Disposition: A | Discharge status: Home / Self Care | Source: Ambulatory Visit | Attending: Family Medicine | Admitting: Family Medicine

## 2023-08-03 DIAGNOSIS — Z13820 Encounter for screening for osteoporosis: Secondary | ICD-10-CM | POA: Insufficient documentation

## 2023-08-03 DIAGNOSIS — Z780 Asymptomatic menopausal state: Secondary | ICD-10-CM | POA: Diagnosis present

## 2023-08-04 ENCOUNTER — Ambulatory Visit: Payer: Self-pay | Admitting: Family Medicine

## 2023-08-07 ENCOUNTER — Telehealth: Payer: Self-pay | Admitting: Family Medicine

## 2023-08-07 NOTE — Telephone Encounter (Unsigned)
 Copied from CRM [id number]. Topic: Clinical - Lab/Test Results >> Aug 07, 2023  9:31 AM Clyde Darling P wrote: Reason for CRM: Pt called to check status of MRI- pt would like to be notified when results are sent. [phone]

## 2023-08-10 ENCOUNTER — Encounter: Payer: Self-pay | Admitting: Family Medicine

## 2023-08-16 ENCOUNTER — Ambulatory Visit: Payer: Self-pay | Admitting: Family Medicine

## 2023-08-18 ENCOUNTER — Encounter: Payer: Self-pay | Admitting: Family Medicine

## 2023-08-18 ENCOUNTER — Other Ambulatory Visit: Payer: Self-pay | Admitting: Family Medicine

## 2023-08-18 DIAGNOSIS — R9089 Other abnormal findings on diagnostic imaging of central nervous system: Secondary | ICD-10-CM

## 2023-09-15 ENCOUNTER — Ambulatory Visit

## 2023-09-15 ENCOUNTER — Other Ambulatory Visit: Payer: Self-pay | Admitting: Neurology

## 2023-09-15 VITALS — BP 122/66 | Ht 64.5 in | Wt 132.0 lb

## 2023-09-15 DIAGNOSIS — Z0000 Encounter for general adult medical examination without abnormal findings: Secondary | ICD-10-CM

## 2023-09-15 DIAGNOSIS — G509 Disorder of trigeminal nerve, unspecified: Secondary | ICD-10-CM

## 2023-09-15 DIAGNOSIS — Z2821 Immunization not carried out because of patient refusal: Secondary | ICD-10-CM

## 2023-09-15 NOTE — Progress Notes (Signed)
 Because this visit was a virtual/telehealth visit,  certain criteria was not obtained, such a blood pressure, CBG if applicable, and timed get up and go. Any medications not marked as taking were not mentioned during the medication reconciliation part of the visit. Any vitals not documented were not able to be obtained due to this being a telehealth visit or patient was unable to self-report a recent blood pressure reading due to a lack of equipment at home via telehealth. Vitals that have been documented are verbally provided by the patient.  This visit was performed by a medical professional under my direct supervision. I was immediately available for consultation/collaboration. I have reviewed and agree with the Annual Wellness Visit documentation.  Subjective:   Dana Arnold is a 73 y.o. who presents for a Medicare Wellness preventive visit.  As a reminder, Annual Wellness Visits don't include a physical exam, and some assessments may be limited, especially if this visit is performed virtually. We may recommend an in-person follow-up visit with your provider if needed.  Visit Complete: Virtual I connected with  Courtenay GORMAN Baptist on 09/15/23 by a audio enabled telemedicine application and verified that I am speaking with the correct person using two identifiers.  Patient Location: Home  Provider Location: Home Office  I discussed the limitations of evaluation and management by telemedicine. The patient expressed understanding and agreed to proceed.  Vital Signs: Because this visit was a virtual/telehealth visit, some criteria may be missing or patient reported. Any vitals not documented were not able to be obtained and vitals that have been documented are patient reported.  VideoDeclined- This patient declined Librarian, academic. Therefore the visit was completed with audio only.  Persons Participating in Visit: Patient.  AWV Questionnaire: No: Patient Medicare AWV  questionnaire was not completed prior to this visit.  Cardiac Risk Factors include: advanced age (>40men, >31 women)     Objective:    Today's Vitals   09/15/23 1030  BP: 122/66  Weight: 132 lb (59.9 kg)  Height: 5' 4.5 (1.638 m)   Body mass index is 22.31 kg/m.     09/15/2023   10:28 AM 10/10/2022    8:51 AM 08/12/2022   11:00 AM 07/08/2021   10:52 AM 06/14/2021    8:16 PM 08/06/2020   10:22 AM 05/05/2019   11:32 AM  Advanced Directives  Does Patient Have a Medical Advance Directive? No No No Yes No No No  Type of Theme park manager;Living will     Copy of Healthcare Power of Attorney in Chart?    No - copy requested     Would patient like information on creating a medical advance directive? No - Patient declined     No - Patient declined No - Patient declined    Current Medications (verified) Outpatient Encounter Medications as of 09/15/2023  Medication Sig   Cholecalciferol (VITAMIN D ) 2000 units tablet Take 3,000 Units by mouth daily.   cyanocobalamin  1000 MCG tablet Take 1,000 mcg by mouth 3 (three) times a week.   Magnesium 200 MG TABS Take 400 mg by mouth daily.   metoprolol succinate (TOPROL-XL) 25 MG 24 hr tablet Take 1 tablet by mouth daily.   OVER THE COUNTER MEDICATION MULTIVIT-MINERALS/FOLIC ACID  (CENTRUM ADULT 50 PLUS ORAL)   No facility-administered encounter medications on file as of 09/15/2023.    Allergies (verified) Terazol 3 [terconazole], Augmentin [amoxicillin-pot clavulanate], Macrobid [nitrofurantoin macrocrystal], and Nitrofurantoin   History: Past  Medical History:  Diagnosis Date   Cervical radiculitis    Cervicalgia    Chondromalacia    Chronic constipation    Degeneration of lumbar or lumbosacral intervertebral disc    External hemorrhoid, thrombosed    Gastritis, chronic    GERD (gastroesophageal reflux disease)    Hyperglycemia    Hyperlipidemia    Lupus anticoagulant positive    Olecranon bursitis     Osteopenia    Ovarian failure    Paresthesia    Vitamin D  deficiency    Past Surgical History:  Procedure Laterality Date   arthroscopic knee Right 05/26/2014   CHOLECYSTECTOMY  02/28/1989   DILATION AND CURETTAGE OF UTERUS     HIP PINNING,CANNULATED Left 08/07/2020   Procedure: Left hip percutaneous pinning;  Surgeon: Tobie Priest, MD;  Location: [facility] ORS;  Service: Orthopedics;  Laterality: Left;   right ulna surgery Right 07/01/2010   TONSILLECTOMY AND ADENOIDECTOMY Bilateral 03/01/1959   Family History  Problem Relation Age of Onset   Osteoporosis Mother    Aneurysm Father    Social History   Socioeconomic History   Marital status: Married    Spouse name: Deward    Number of children: 3   Years of education: Not on file   Highest education level: Some college, no degree  Occupational History   Occupation:  Runner, broadcasting/film/video assistance     Comment: [employer]   Tobacco Use   Smoking status: Never   Smokeless tobacco: Never  Vaping Use   Vaping status: Never Used  Substance and Sexual Activity   Alcohol use: No    Alcohol/week: 0.0 standard drinks of alcohol   Drug use: No   Sexual activity: Yes    Partners: Male    Birth control/protection: Post-menopausal  Other Topics Concern   Not on file  Social History Narrative   Not on file   Social Drivers of Health   Financial Resource Strain: Low Risk  (09/15/2023)   Overall Financial Resource Strain (CARDIA)    Difficulty of Paying Living Expenses: Not hard at all  Food Insecurity: No Food Insecurity (09/15/2023)   Hunger Vital Sign    Worried About Running Out of Food in the Last Year: Never true    Ran Out of Food in the Last Year: Never true  Transportation Needs: No Transportation Needs (09/15/2023)   PRAPARE - Administrator, Civil Service (Medical): No    Lack of Transportation (Non-Medical): No  Physical Activity: Insufficiently Active (09/15/2023)   Exercise Vital Sign    Days of Exercise per Week: 4  days    Minutes of Exercise per Session: 20 min  Stress: No Stress Concern Present (09/15/2023)   [employer] of Occupational Health - Occupational Stress Questionnaire    Feeling of Stress: Not at all  Social Connections: Socially Integrated (09/15/2023)   Social Connection and Isolation Panel    Frequency of Communication with Friends and Family: More than three times a week    Frequency of Social Gatherings with Friends and Family: Once a week    Attends Religious Services: More than 4 times per year    Active Member of [employer] or Organizations: Yes    Attends Engineer, structural: More than 4 times per year    Marital Status: Married    Tobacco Counseling Counseling given: Not Answered    Clinical Intake:  Pre-visit preparation completed: Yes  Pain : No/denies pain     BMI - recorded: 22.31 Nutritional Status:  BMI of 19-24  Normal Nutritional Risks: None Diabetes: No  Lab Results  Component Value Date   HGBA1C 6.0 (H) 06/06/2023   HGBA1C 6.0 (H) 10/20/2022   HGBA1C 5.9 (H) 04/15/2022     How often do you need to have someone help you when you read instructions, pamphlets, or other written materials from your doctor or pharmacy?: 1 - Never What is the last grade level you completed in school?: college degree  Interpreter Needed?: No  Information entered by :: Rosie Torrez,CMA   Activities of Daily Living     09/15/2023   10:36 AM 01/23/2023    8:41 AM  In your present state of health, do you have any difficulty performing the following activities:  Hearing? 0 0  Vision? 0 0  Difficulty concentrating or making decisions? 0 0  Walking or climbing stairs? 0 0  Dressing or bathing? 0 0  Doing errands, shopping? 0 0  Preparing Food and eating ? N   Using the Toilet? N   In the past six months, have you accidently leaked urine? N   Do you have problems with loss of bowel control? N   Managing your Medications? N   Managing your Finances? N    Housekeeping or managing your Housekeeping? N     Patient Care Team: Sowles, Krichna, MD as PCP - General (Family Medicine) Isenstein, Arin L, MD (Dermatology) Ollie Myrick GAILS, MD (Gastroenterology) Curlene Agent, MD as Consulting Physician (Obstetrics and Gynecology) Damian Therisa HERO, MD as Physician Assistant (Endocrinology)  I have updated your Care Teams any recent Medical Services you may have received from other providers in the past year.     Assessment:   This is a routine wellness examination for Avilene.  Hearing/Vision screen Hearing Screening - Comments:: No difficulties Vision Screening - Comments:: Patient wears readers otc   Goals Addressed             This Visit's Progress    Increase physical activity   On track    Pt would like to walk 2 miles at least 3 days per week        Depression Screen     09/15/2023   10:37 AM 07/27/2023    8:20 AM 06/06/2023    9:32 AM 01/23/2023    8:41 AM 01/11/2023   11:17 AM 10/20/2022    7:55 AM 08/12/2022   10:50 AM  PHQ 2/9 Scores  PHQ - 2 Score 0 0 0 0 0 0 0  PHQ- 9 Score 0 0 0   0     Fall Risk     09/15/2023   10:35 AM 07/27/2023    8:15 AM 06/06/2023    9:30 AM 01/23/2023    8:41 AM 01/11/2023   11:17 AM  Fall Risk   Falls in the past year? 0 0 0 0 0  Number falls in past yr: 0 0 0 0 0  Injury with Fall? 0 0 0 0 0  Risk for fall due to : No Fall Risks No Fall Risks No Fall Risks No Fall Risks No Fall Risks  Follow up Falls evaluation completed Falls prevention discussed;Education provided;Falls evaluation completed Falls prevention discussed;Education provided;Falls evaluation completed  Falls prevention discussed    MEDICARE RISK AT HOME:  Medicare Risk at Home Any stairs in or around the home?: Yes If so, are there any without handrails?: No Home free of loose throw rugs in walkways, pet beds, electrical cords, etc?: Yes Adequate  lighting in your home to reduce risk of falls?: Yes Life alert?:  No Use of a cane, walker or w/c?: No Grab bars in the bathroom?: Yes Shower chair or bench in shower?: No Elevated toilet seat or a handicapped toilet?: No  TIMED UP AND GO:  Was the test performed?  No  Cognitive Function: 6CIT completed        09/15/2023   10:34 AM 08/12/2022   11:12 AM  6CIT Screen  What Year? 0 points 0 points  What month? 0 points 0 points  What time? 0 points 0 points  Count back from 20 0 points 0 points  Months in reverse 0 points 0 points  Repeat phrase 0 points 0 points  Total Score 0 points 0 points    Immunizations Immunization History  Administered Date(s) Administered   Influenza, High Dose Seasonal PF 12/21/2018   Influenza,inj,Quad PF,6+ Mos 11/14/2013   Influenza-Unspecified 12/16/2014   PFIZER Comirnaty(Gray Top)Covid-19 Tri-Sucrose Vaccine 03/19/2019, 04/11/2019   PFIZER(Purple Top)SARS-COV-2 Vaccination 12/18/2019   PNEUMOCOCCAL CONJUGATE-20 04/15/2022   Pfizer Covid-19 Vaccine Bivalent Booster 37yrs & up 02/04/2021   Td 05/21/2009   Tdap 12/24/2014   Zoster Recombinant(Shingrix ) 08/02/2018, 11/27/2018   Zoster, Live 05/22/2015    Screening Tests Health Maintenance  Topic Date Due   COVID-19 Vaccine (5 - 2024-25 season) 10/30/2022   INFLUENZA VACCINE  09/29/2023   Medicare Annual Wellness (AWV)  09/14/2024   DTaP/Tdap/Td (3 - Td or Tdap) 12/23/2024   MAMMOGRAM  03/12/2025   Colonoscopy  12/16/2027   Pneumococcal Vaccine: 50+ Years  Completed   DEXA SCAN  Completed   Hepatitis C Screening  Completed   Zoster Vaccines- Shingrix   Completed   Hepatitis B Vaccines  Aged Out   HPV VACCINES  Aged Out   Meningococcal B Vaccine  Aged Out    Health Maintenance  Health Maintenance Due  Topic Date Due   COVID-19 Vaccine (5 - 2024-25 season) 10/30/2022   Health Maintenance Items Addressed:patient declined   Additional Screening:  Vision Screening: Recommended annual ophthalmology exams for early detection of glaucoma and  other disorders of the eye. Would you like a referral to an eye doctor? No    Dental Screening: Recommended annual dental exams for proper oral hygiene  Community Resource Referral / Chronic Care Management: CRR required this visit?  No   CCM required this visit?  No   Plan:    I have personally reviewed and noted the following in the patient's chart:   Medical and social history Use of alcohol, tobacco or illicit drugs  Current medications and supplements including opioid prescriptions. Patient is not currently taking opioid prescriptions. Functional ability and status Nutritional status Physical activity Advanced directives List of other physicians Hospitalizations, surgeries, and ER visits in previous 12 months Vitals Screenings to include cognitive, depression, and falls Referrals and appointments  In addition, I have reviewed and discussed with patient certain preventive protocols, quality metrics, and best practice recommendations. A written personalized care plan for preventive services as well as general preventive health recommendations were provided to patient.   Lyle MARLA Right, [place]   09/15/2023   After Visit Summary: (MyChart) Due to this being a telephonic visit, the after visit summary with patients personalized plan was offered to patient via MyChart   Notes: Nothing significant to report at this time.

## 2023-09-15 NOTE — Patient Instructions (Signed)
 Ms. Len , Thank you for taking time out of your busy schedule to complete your Annual Wellness Visit with me. I enjoyed our conversation and look forward to speaking with you again next year. I, as well as your care team,  appreciate your ongoing commitment to your health goals. Please review the following plan we discussed and let me know if I can assist you in the future. Your Game plan/ To Do List    Referrals: If you haven't heard from the office you've been referred to, please reach out to them at the phone provided.  none Follow up Visits: Next Medicare AWV with our clinical staff: 09/19/2024   Have you seen your provider in the last 6 months (3 months if uncontrolled diabetes)? Yes Next Office Visit with your provider: 12/07/2023  Clinician Recommendations:  Aim for 30 minutes of exercise or brisk walking, 6-8 glasses of water, and 5 servings of fruits and vegetables each day.       This is a list of the screening recommended for you and due dates:  Health Maintenance  Topic Date Due   COVID-19 Vaccine (5 - 2024-25 season) 10/30/2022   Flu Shot  09/29/2023   Medicare Annual Wellness Visit  09/14/2024   DTaP/Tdap/Td vaccine (3 - Td or Tdap) 12/23/2024   Mammogram  03/12/2025   Colon Cancer Screening  12/16/2027   Pneumococcal Vaccine for age over 31  Completed   DEXA scan (bone density measurement)  Completed   Hepatitis C Screening  Completed   Zoster (Shingles) Vaccine  Completed   Hepatitis B Vaccine  Aged Out   HPV Vaccine  Aged Out   Meningitis B Vaccine  Aged Out    Advanced directives: (Declined) Advance directive discussed with you today. Even though you declined this today, please call our office should you change your mind, and we can give you the proper paperwork for you to fill out. Advance Care Planning is important because it:  [x]  Makes sure you receive the medical care that is consistent with your values, goals, and preferences  [x]  It provides guidance to  your family and loved ones and reduces their decisional burden about whether or not they are making the right decisions based on your wishes.  Follow the link provided in your after visit summary or read over the paperwork we have mailed to you to help you started getting your Advance Directives in place. If you need assistance in completing these, please reach out to us  so that we can help you!  See attachments for Preventive Care and Fall Prevention Tips.

## 2023-09-19 ENCOUNTER — Ambulatory Visit
Admission: RE | Admit: 2023-09-19 | Discharge: 2023-09-19 | Disposition: A | Discharge status: Home / Self Care | Source: Ambulatory Visit | Attending: Neurology | Admitting: Neurology

## 2023-09-19 DIAGNOSIS — G509 Disorder of trigeminal nerve, unspecified: Secondary | ICD-10-CM | POA: Diagnosis present

## 2023-09-22 ENCOUNTER — Encounter: Payer: Self-pay | Admitting: Family Medicine

## 2023-09-25 ENCOUNTER — Other Ambulatory Visit: Payer: Self-pay | Admitting: Family Medicine

## 2023-09-25 DIAGNOSIS — G8929 Other chronic pain: Secondary | ICD-10-CM

## 2023-10-11 DIAGNOSIS — M6281 Muscle weakness (generalized): Secondary | ICD-10-CM | POA: Diagnosis not present

## 2023-10-11 DIAGNOSIS — M542 Cervicalgia: Secondary | ICD-10-CM | POA: Diagnosis not present

## 2023-10-11 DIAGNOSIS — R6884 Jaw pain: Secondary | ICD-10-CM | POA: Diagnosis not present

## 2023-10-25 DIAGNOSIS — M542 Cervicalgia: Secondary | ICD-10-CM | POA: Diagnosis not present

## 2023-10-25 DIAGNOSIS — M6281 Muscle weakness (generalized): Secondary | ICD-10-CM | POA: Diagnosis not present

## 2023-10-25 DIAGNOSIS — R6884 Jaw pain: Secondary | ICD-10-CM | POA: Diagnosis not present

## 2023-11-08 DIAGNOSIS — R6884 Jaw pain: Secondary | ICD-10-CM | POA: Diagnosis not present

## 2023-11-08 DIAGNOSIS — M542 Cervicalgia: Secondary | ICD-10-CM | POA: Diagnosis not present

## 2023-11-08 DIAGNOSIS — M6281 Muscle weakness (generalized): Secondary | ICD-10-CM | POA: Diagnosis not present

## 2023-11-14 DIAGNOSIS — M5030 Other cervical disc degeneration, unspecified cervical region: Secondary | ICD-10-CM | POA: Diagnosis not present

## 2023-11-14 DIAGNOSIS — G939 Disorder of brain, unspecified: Secondary | ICD-10-CM | POA: Diagnosis not present

## 2023-11-14 DIAGNOSIS — F959 Tic disorder, unspecified: Secondary | ICD-10-CM | POA: Diagnosis not present

## 2023-11-14 DIAGNOSIS — G509 Disorder of trigeminal nerve, unspecified: Secondary | ICD-10-CM | POA: Diagnosis not present

## 2023-11-14 DIAGNOSIS — G4701 Insomnia due to medical condition: Secondary | ICD-10-CM | POA: Diagnosis not present

## 2023-11-14 DIAGNOSIS — M5481 Occipital neuralgia: Secondary | ICD-10-CM | POA: Diagnosis not present

## 2023-12-06 DIAGNOSIS — R6884 Jaw pain: Secondary | ICD-10-CM | POA: Diagnosis not present

## 2023-12-06 DIAGNOSIS — M542 Cervicalgia: Secondary | ICD-10-CM | POA: Diagnosis not present

## 2023-12-06 DIAGNOSIS — M6281 Muscle weakness (generalized): Secondary | ICD-10-CM | POA: Diagnosis not present

## 2023-12-07 ENCOUNTER — Ambulatory Visit: Admitting: Family Medicine

## 2023-12-07 ENCOUNTER — Encounter: Payer: Self-pay | Admitting: Family Medicine

## 2023-12-07 VITALS — BP 132/80 | HR 83 | Resp 16 | Ht 64.5 in | Wt 133.5 lb

## 2023-12-07 DIAGNOSIS — M542 Cervicalgia: Secondary | ICD-10-CM | POA: Diagnosis not present

## 2023-12-07 DIAGNOSIS — G509 Disorder of trigeminal nerve, unspecified: Secondary | ICD-10-CM | POA: Insufficient documentation

## 2023-12-07 DIAGNOSIS — I428 Other cardiomyopathies: Secondary | ICD-10-CM

## 2023-12-07 DIAGNOSIS — E538 Deficiency of other specified B group vitamins: Secondary | ICD-10-CM

## 2023-12-07 DIAGNOSIS — Q433 Congenital malformations of intestinal fixation: Secondary | ICD-10-CM

## 2023-12-07 DIAGNOSIS — E038 Other specified hypothyroidism: Secondary | ICD-10-CM

## 2023-12-07 DIAGNOSIS — I700 Atherosclerosis of aorta: Secondary | ICD-10-CM

## 2023-12-07 DIAGNOSIS — R739 Hyperglycemia, unspecified: Secondary | ICD-10-CM

## 2023-12-07 DIAGNOSIS — G939 Disorder of brain, unspecified: Secondary | ICD-10-CM | POA: Insufficient documentation

## 2023-12-07 DIAGNOSIS — G8929 Other chronic pain: Secondary | ICD-10-CM

## 2023-12-07 DIAGNOSIS — M5481 Occipital neuralgia: Secondary | ICD-10-CM | POA: Diagnosis not present

## 2023-12-07 DIAGNOSIS — M810 Age-related osteoporosis without current pathological fracture: Secondary | ICD-10-CM

## 2023-12-07 NOTE — Progress Notes (Signed)
 Name: Dana Arnold   MRN: [id number]    DOB: 1950-07-22   Date:12/07/2023       Progress Note  Subjective  Chief Complaint  Chief Complaint  Patient presents with   Medical Management of Chronic Issues   Discussed the use of AI scribe software for clinical note transcription with the patient, who gave verbal consent to proceed.  History of Present Illness Dana Arnold is a 73 year old female with trigeminal neuropathy and chronic neck pain who presents for a follow-up visit.  She experiences paresthesia on both sides of her face, which has been improving. She was diagnosed with trigeminal neuropathy and trigeminal neuralgia. She has been seeing a manual physical therapist who has provided exercises and dry needling, which have helped alleviate her symptoms.  She has chronic neck pain and occipital neuralgia on the left side. The physical therapist has been performing dry needling on the occipital area, which has provided relief. She continues to perform stretches daily.  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 This condition causes her to wake up at night, and she sometimes struggles to fall back asleep. She has tried various dietary adjustments and Metamucil, which was ineffective and caused bloating.  She has postmenopausal osteoporosis, particularly in the right femur neck, but is not currently taking medications for it due to concerns about dental health and medication side effects.  She has non-ischemic cardiomyopathy and is taking metoprolol. She reports that an echocardiogram in September showed her ejection fraction was about 50%.  She has subclinical hyperthyroidism, with a TSH level of 2.70 as of April, and her lipid panel showed a bad cholesterol level of 125 and good cholesterol level of 70. She is not on cholesterol medication but is monitoring her levels.  She has a history of plaque on the aorta but her cardiac  calcium  CT showed clear heart vessels. She is not taking statins and prefers to avoid medications unless necessary.    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   Gastro-esophageal reflux disease without esophagitis 04/14/2021   Tortuous colon 04/14/2021   History of fracture of left hip 10/14/2020   Osteoarthritis 10/22/2019   Chronic neck pain 04/05/2019   Osteoporosis, post-menopausal 01/03/2019   Scoliosis of lumbar spine 04/22/2016   Thoracic scoliosis 09/08/2015   Chondromalacia 03/10/2015   Hyperglycemia 12/24/2014   Cervical radiculitis 12/24/2014   Subclinical hypothyroidism 12/24/2014   Vitamin D  deficiency 12/24/2014   External hemorrhoid 12/24/2014   Chondrocalcinosis of right knee 12/24/2014   DDD (degenerative disc disease), lumbar 12/24/2014    Past Surgical History:  Procedure Laterality Date   arthroscopic knee Right 05/26/2014   CHOLECYSTECTOMY  02/28/1989   DILATION AND CURETTAGE OF UTERUS     HIP PINNING,CANNULATED Left 08/07/2020   Procedure: Left hip percutaneous pinning;  Surgeon: Tobie Priest, MD;  Location: [facility] ORS;  Service: Orthopedics;  Laterality: Left;   right ulna surgery Right 07/01/2010   TONSILLECTOMY AND ADENOIDECTOMY Bilateral 03/01/1959    Family History  Problem Relation Age of Onset  Osteoporosis Mother    Aneurysm Father     Social History   Tobacco Use   Smoking status: Never   Smokeless tobacco: Never  Substance Use Topics   Alcohol use: No    Alcohol/week: 0.0 standard drinks  of alcohol     Current Outpatient Medications:    Cholecalciferol (VITAMIN D ) 2000 units tablet, Take 3,000 Units by mouth daily., Disp: , Rfl:    cyanocobalamin  1000 MCG tablet, Take 1,000 mcg by mouth 3 (three) times a week., Disp: , Rfl:    Magnesium 200 MG TABS, Take 400 mg by mouth daily., Disp: , Rfl:    metoprolol succinate (TOPROL-XL) 25 MG 24 hr tablet, Take 1 tablet by mouth daily., Disp: , Rfl:    OVER THE COUNTER MEDICATION, MULTIVIT-MINERALS/FOLIC ACID  (CENTRUM ADULT 50 PLUS ORAL), Disp: , Rfl:   Allergies  Allergen Reactions   Terazol 3 [Terconazole] Swelling and Rash   Augmentin [Amoxicillin-Pot Clavulanate] Itching    Hands    Macrobid [Nitrofurantoin Macrocrystal]    Nitrofurantoin Other (See Comments)    nitrofurantoin  Itchy palms    I personally reviewed active problem list, medication list, allergies, family history with the patient/caregiver today.   ROS  Ten systems reviewed and is negative except as mentioned in HPI    Objective Physical Exam CONSTITUTIONAL: Patient appears well-developed and well-nourished. No distress. HEENT: Head atraumatic, normocephalic, neck supple. CARDIOVASCULAR: Normal rate, regular rhythm, and normal heart sounds. No murmur heard. No edema in extremities. PULMONARY: Effort normal and breath sounds normal. No respiratory distress. ABDOMINAL: There is no tenderness or distention. MUSCULOSKELETAL: Normal gait. Without gross motor or sensory deficit. PSYCHIATRIC: Patient has a normal mood and affect. Behavior is normal. Judgment and thought content normal.  Vitals:   12/07/23 0818  BP: 132/80  Pulse: 83  Resp: 16  SpO2: 99%  Weight: 133 lb 8 oz (60.6 kg)  Height: 5' 4.5 (1.638 m)    Body mass index is 22.56 kg/m.    PHQ2/9:    12/07/2023    8:13 AM 09/15/2023   10:37 AM 07/27/2023    8:20 AM 06/06/2023    9:32 AM 01/23/2023    8:41 AM  Depression screen PHQ 2/9  Decreased Interest 0 0 0 0 0  Down, Depressed,  Hopeless 0 0 0 0 0  PHQ - 2 Score 0 0 0 0 0  Altered sleeping  0 0 0   Tired, decreased energy  0 0 0   Change in appetite  0 0 0   Feeling bad or failure about yourself   0 0 0   Trouble concentrating  0 0 0   Moving slowly or fidgety/restless  0 0 0   Suicidal thoughts  0 0 0   PHQ-9 Score  0 0 0   Difficult doing work/chores  Not difficult at all Not difficult at all Not difficult at all     phq 9 is negative  Fall Risk:    12/07/2023    8:13 AM 09/15/2023   10:35 AM 07/27/2023    8:15 AM 06/06/2023    9:30 AM 01/23/2023    8:41 AM  Fall Risk   Falls in the past year? 0 0 0 0 0  Number falls in past yr: 0 0 0 0 0  Injury with Fall? 0 0 0 0 0  Risk for fall due to : No Fall Risks No Fall Risks No Fall Risks No Fall Risks No Fall Risks  Follow up Falls evaluation completed Falls evaluation completed Falls prevention discussed;Education provided;Falls evaluation completed Falls prevention discussed;Education provided;Falls evaluation completed       Assessment & Plan Trigeminal neuropathy and neuralgia Bilateral trigeminal neuropathy and neuralgia improved with manual physical therapy and dry needling. - Continue manual physical therapy and dry needling as needed.  Chronic neck pain and left occipital neuralgia Chronic neck pain and left occipital neuralgia managed with manual physical therapy and dry needling, providing significant relief. - Continue manual physical therapy and dry needling as needed.  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 Metamucil ineffective and caused bloating. - Continue self-massage for abdominal discomfort. - Avoid Metamucil due to adverse effects.  Non-ischemic cardiomyopathy Non-ischemic cardiomyopathy with improved ejection fraction to 50% on beta-blocker therapy. Cardiac calcium  CT showed no coronary artery disease. - Continue metoprolol for  cardiomyopathy management.  Osteoporosis, right femoral neck, postmenopausal Postmenopausal osteoporosis with right femoral neck changes. Concerns about bisphosphonates affecting dental implant healing. Discussed alternative treatments such as Prolia and Forteo. - Discuss Prolia with dentist for osteoporosis management. - Consider endocrinology referral for osteoporosis treatment options.  Subclinical hyperthyroidism Subclinical hyperthyroidism with TSH levels at 2.70 as of April.  Chronic non-specific white matter brain lesions Chronic non-specific white matter lesions on MRI with no mass effect.

## 2023-12-07 NOTE — Patient Instructions (Signed)
Prolia for osteoporosis

## 2023-12-20 ENCOUNTER — Encounter: Payer: Self-pay | Admitting: Family Medicine

## 2024-01-09 DIAGNOSIS — I428 Other cardiomyopathies: Secondary | ICD-10-CM | POA: Diagnosis not present

## 2024-01-09 DIAGNOSIS — I447 Left bundle-branch block, unspecified: Secondary | ICD-10-CM | POA: Diagnosis not present

## 2024-01-10 DIAGNOSIS — M542 Cervicalgia: Secondary | ICD-10-CM | POA: Diagnosis not present

## 2024-01-10 DIAGNOSIS — M6281 Muscle weakness (generalized): Secondary | ICD-10-CM | POA: Diagnosis not present

## 2024-01-10 DIAGNOSIS — R6884 Jaw pain: Secondary | ICD-10-CM | POA: Diagnosis not present

## 2024-01-15 DIAGNOSIS — I447 Left bundle-branch block, unspecified: Secondary | ICD-10-CM | POA: Diagnosis not present

## 2024-01-15 DIAGNOSIS — I428 Other cardiomyopathies: Secondary | ICD-10-CM | POA: Diagnosis not present

## 2024-01-15 DIAGNOSIS — E782 Mixed hyperlipidemia: Secondary | ICD-10-CM | POA: Diagnosis not present

## 2024-01-17 DIAGNOSIS — R1010 Upper abdominal pain, unspecified: Secondary | ICD-10-CM | POA: Diagnosis not present

## 2024-01-17 DIAGNOSIS — K5904 Chronic idiopathic constipation: Secondary | ICD-10-CM | POA: Diagnosis not present

## 2024-01-17 DIAGNOSIS — K219 Gastro-esophageal reflux disease without esophagitis: Secondary | ICD-10-CM | POA: Diagnosis not present

## 2024-01-17 DIAGNOSIS — K648 Other hemorrhoids: Secondary | ICD-10-CM | POA: Diagnosis not present

## 2024-01-28 IMAGING — CT CT ABD-PELV W/ CM
2 of 5 series · 15 of 46 positions shown, 17 images · IV contrast (APPLIED)
Comparison: Similar study 05/05/2019

CLINICAL DATA: Vomiting, nausea and abdominal pain.

EXAM:
CT ABDOMEN AND PELVIS WITH CONTRAST
TECHNIQUE: Multidetector CT imaging of the abdomen and pelvis was performed
using the standard protocol following bolus administration of
intravenous contrast.

[Series 2: abdomen 5.0 (person_name) · axial · 0.73mm/px · z∈[-1365,-980]mm · 12 of 93 slices shown, 14 images]
[im 8/93  soft-tissue]
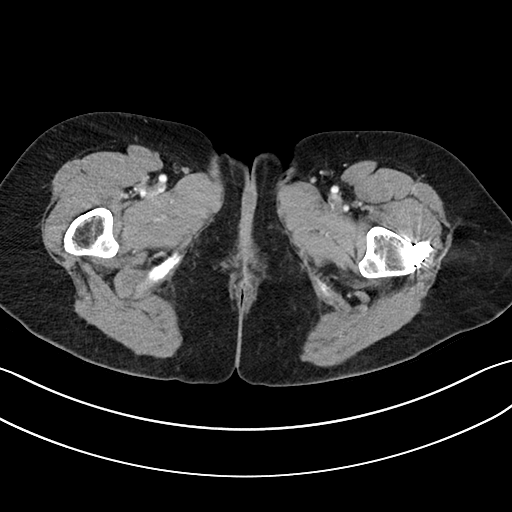
[im 8/93  bone]
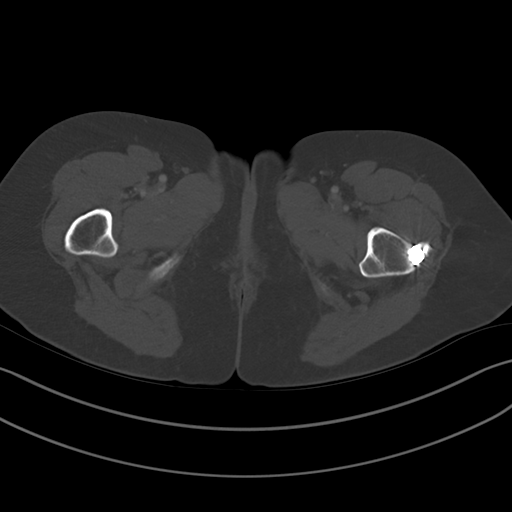
[im 15/93  soft-tissue]
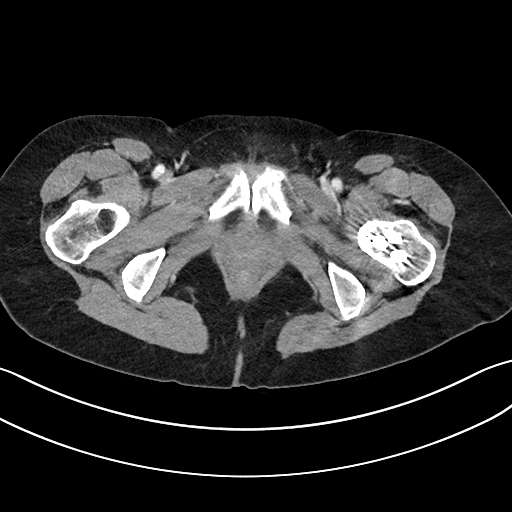
[im 22/93  soft-tissue]
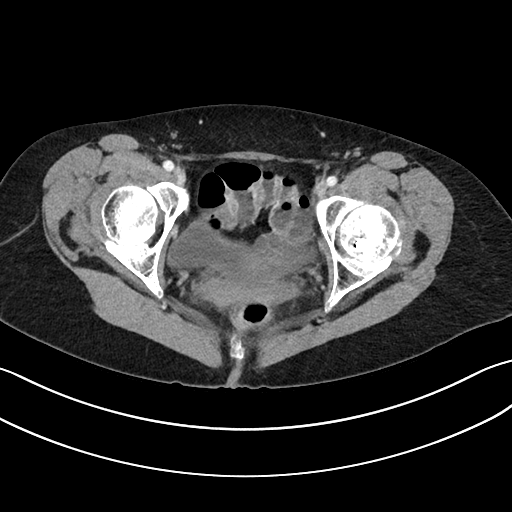
[im 29/93  soft-tissue]
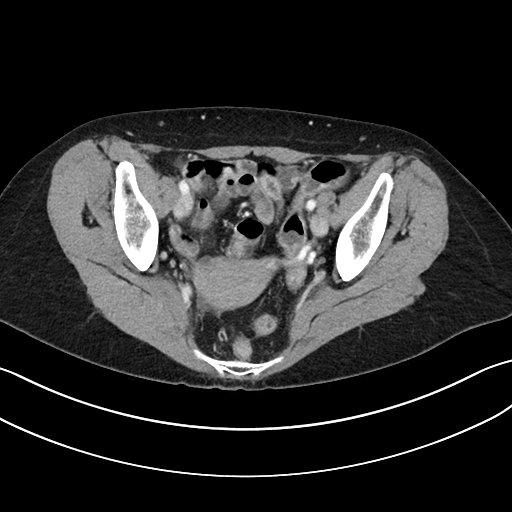
[im 36/93  soft-tissue]
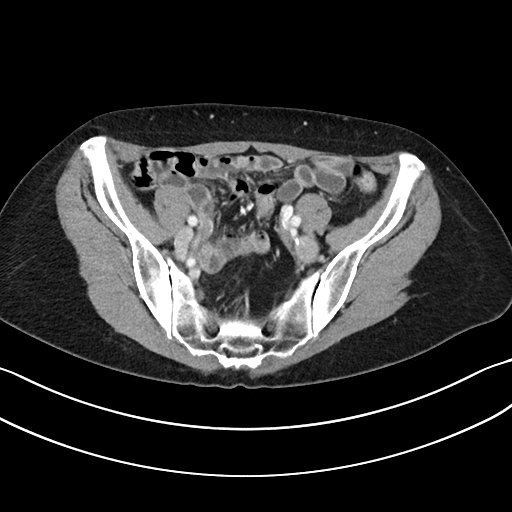
[im 43/93  soft-tissue]
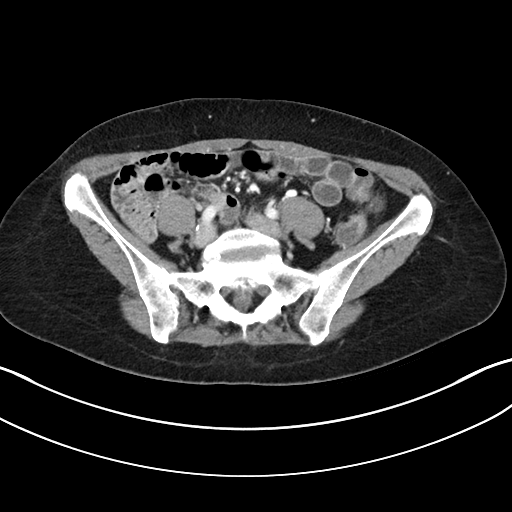
[im 50/93  soft-tissue]
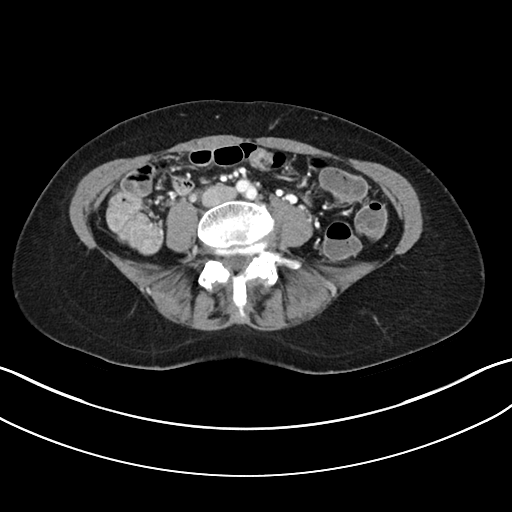
[im 57/93  soft-tissue]
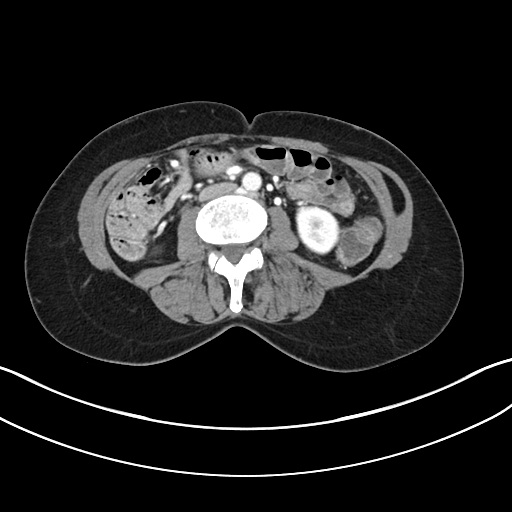
[im 64/93  soft-tissue]
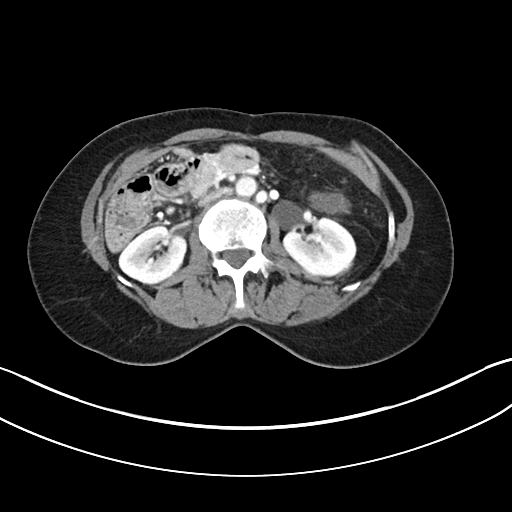
[im 64/93  bone]
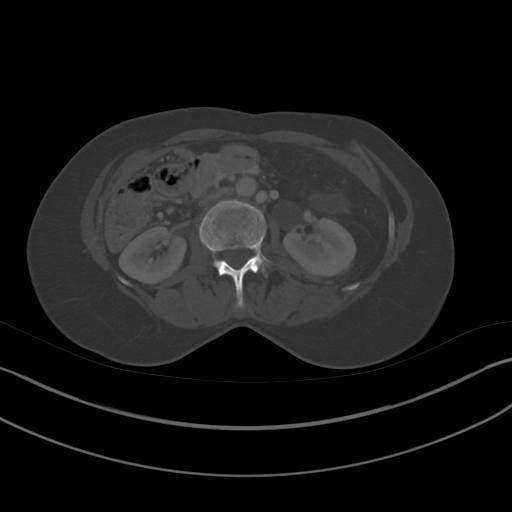
[im 71/93  soft-tissue]
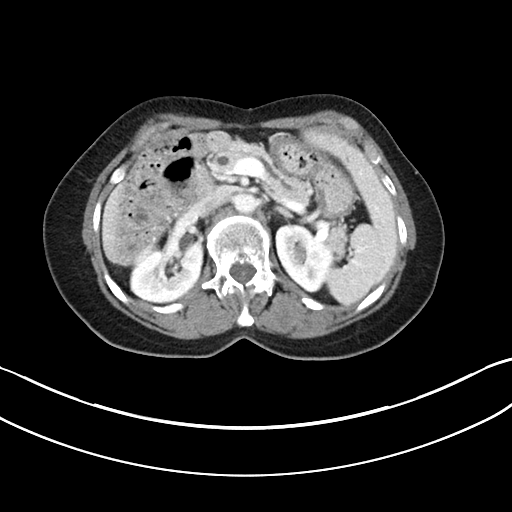
[im 78/93  soft-tissue]
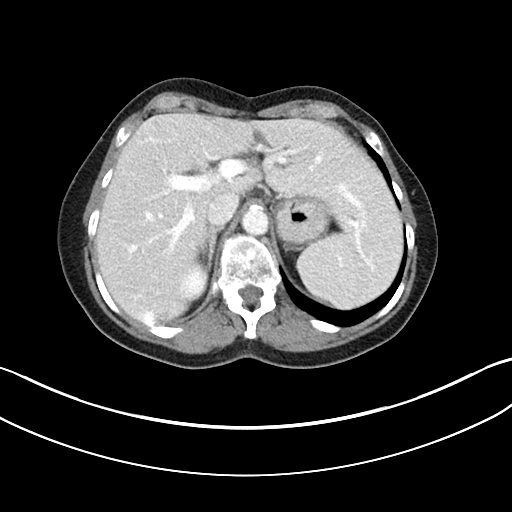
[im 85/93  soft-tissue]
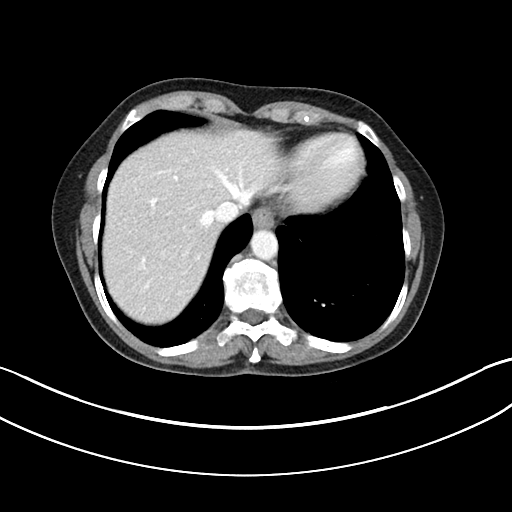

[Series 5: abdomen 3.0 (person_name) · coronal · 0.81mm/px · 3 of 69 slices shown]
[im 23/69  soft-tissue]
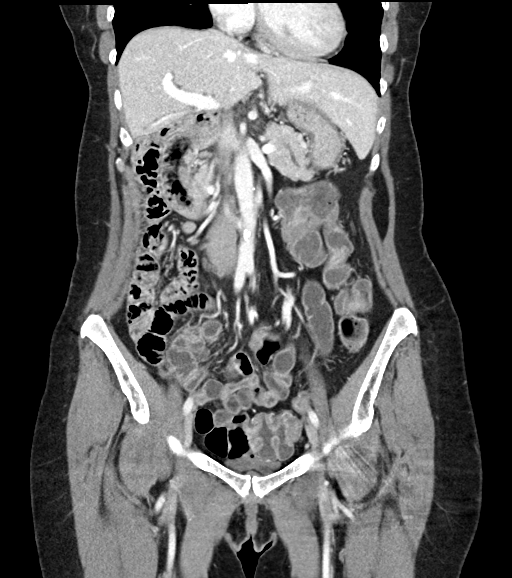
[im 31/69  soft-tissue]
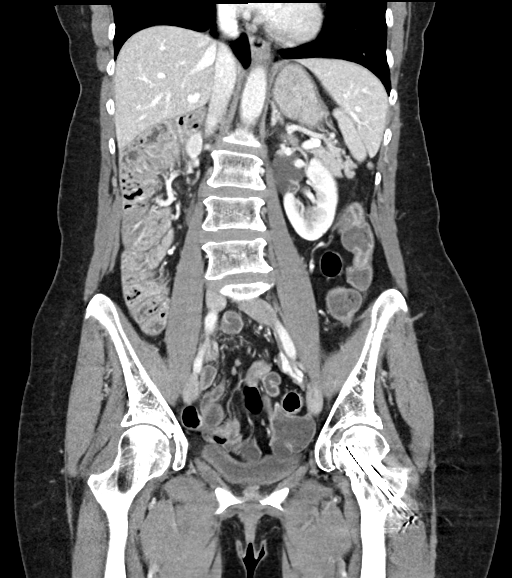
[im 38/69  soft-tissue]
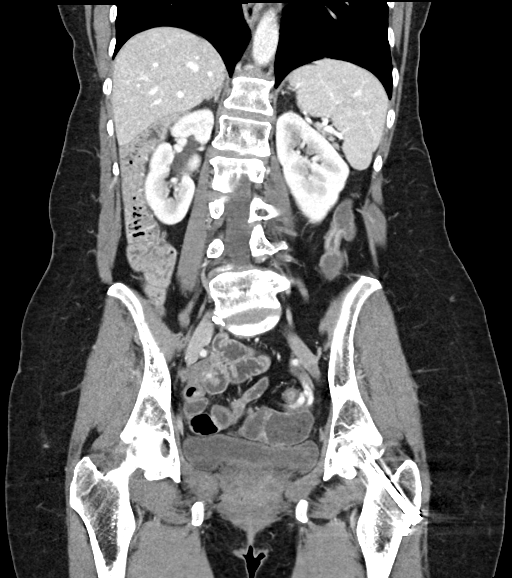

[15 of 46 positions shown; findings below may reference images not displayed]

RADIATION DOSE REDUCTION: This exam was performed according to the
departmental dose-optimization program which includes automated
exposure control, adjustment of the mA and/or kV according to
patient size and/or use of iterative reconstruction technique.

CONTRAST:  100mL OMNIPAQUE IOHEXOL 300 MG/ML  SOLN
FINDINGS: Lower chest: No acute abnormality. Chronic elevation right
hemidiaphragm.

Hepatobiliary: The liver is mildly prominent, mildly steatotic but
without mass enhancement. A 1.5 cm flash filling hemangioma is again
noted in the posterior segment of the right lobe. Gallbladder is
absent and mild chronically prominent common bile duct and central
intrahepatic bile ducts are again noted but no more than previously.

Pancreas: There is no mass enhancement. Mild ductal dilatation is
unchanged. No adjacent inflammation.

Spleen: Unremarkable.  Normal in size.

Adrenals/Urinary Tract: There is no adrenal mass, no renal cortical
mass enhancement. There are bilateral extrarenal pelves pooling the
contrast in the delayed phase but no evidence of intrarenal
caliectasis or ureterectasis. There is no bladder thickening.

Stomach/Bowel: There are thickened folds in the proximal/midportion
of the stomach. Additional thickened folds in the third duodenal
segment with fluid-filled normal caliber mid to lower abdominal
small bowel with some segments showing mucosal enhancement without
thickening.

There is fluid in the colon, without wall thickening. An appendix is
not seen in this patient. Bowel nonrotation is again shown with the
colon in the central and left abdomen, majority of the small bowel
in the right abdomen including the duodenal jejunal junction.

Vascular/Lymphatic: Aortic atherosclerosis. No enlarged abdominal or
pelvic lymph nodes. Left pelvic venous congestion is again noted. No
adenopathy is seen.

Reproductive: The uterus is intact.  No adnexal mass is evident.

Other: There is no free air, hemorrhage or fluid. There is no
incarcerated hernia.

Musculoskeletal: There are degenerative changes of the spine and
mild lumbar dextrorotary scoliosis. Interval new demonstration of 3
threaded left hip pins with healed fracture deformity in the femoral
neck with shortening and mild secondary DJD. There is facet
hypertrophy in the lower lumbar spine.
IMPRESSION: 1. Imaging findings of gastroenteritis, with congenital bowel
nonrotation. No dilated segments or mesenteric inflammatory changes.
2. Left pelvic venous congestion.
3. Aortic atherosclerosis.
4. 1.5 cm liver hemangioma, with mild prominence and steatosis of
the liver.

## 2024-01-31 DIAGNOSIS — M542 Cervicalgia: Secondary | ICD-10-CM | POA: Diagnosis not present

## 2024-01-31 DIAGNOSIS — M6281 Muscle weakness (generalized): Secondary | ICD-10-CM | POA: Diagnosis not present

## 2024-01-31 DIAGNOSIS — R6884 Jaw pain: Secondary | ICD-10-CM | POA: Diagnosis not present

## 2024-02-01 ENCOUNTER — Ambulatory Visit
Admission: RE | Admit: 2024-02-01 | Discharge: 2024-02-01 | Disposition: A | Discharge status: Home / Self Care | Attending: Emergency Medicine | Admitting: Emergency Medicine

## 2024-02-01 VITALS — BP 136/78 | HR 90 | Temp 98.8°F | Resp 18

## 2024-02-01 DIAGNOSIS — J069 Acute upper respiratory infection, unspecified: Secondary | ICD-10-CM | POA: Diagnosis not present

## 2024-02-01 LAB — POC COVID19/FLU A&B COMBO
Covid Antigen, POC: NEGATIVE
Influenza A Antigen, POC: NEGATIVE
Influenza B Antigen, POC: NEGATIVE

## 2024-02-01 NOTE — Discharge Instructions (Addendum)
 The COVID and flu tests are negative.   Take Tylenol as needed for fever or discomfort.  Take plain Mucinex as needed for congestion.  Rest and keep yourself hydrated.    Follow-up with your primary care provider if your symptoms are not improving.

## 2024-02-01 NOTE — ED Triage Notes (Addendum)
 Patient to Urgent Care with complaints of fatigue/ nasal congestion/ headaches.  Symptoms started yesterday.  No otc meds. States she tolerates Zithromax  well. Hx of c diff.

## 2024-02-01 NOTE — ED Provider Notes (Signed)
 Dana Arnold    CSN: [id number] Arrival date & time: 02/01/24  1253      History   Chief Complaint Chief Complaint  Patient presents with   Nasal Congestion    I have some dry coughing and congestion that is coming out from my throat. - Entered by patient    HPI Dana Arnold is a 73 y.o. female.  Patient presents with 1 day history of runny nose, congestion, mild cough, headache, fatigue.  No OTC medications taken.  No fever, shortness of breath, vomiting, diarrhea.  The history is provided by the patient and medical records.    Past Medical History:  Diagnosis Date   Cervical radiculitis    Cervicalgia    Chondromalacia    Chronic constipation    Degeneration of lumbar or lumbosacral intervertebral disc    External hemorrhoid, thrombosed    Gastritis, chronic    GERD (gastroesophageal reflux disease)    Hyperglycemia    Hyperlipidemia    Lupus anticoagulant positive    Olecranon bursitis    Osteopenia    Ovarian failure    Paresthesia    Vitamin D  deficiency     Patient Active Problem List   Diagnosis Date Noted   Trigeminal neuropathy 12/07/2023   Occipital neuralgia of left side 12/07/2023   Chronic non-specific white matter lesions on MRI 12/07/2023   Coagulation disorder 05/02/2023   LBBB (left bundle branch block) 04/15/2022   PVC (premature ventricular contraction) 10/13/2021   Pre-diabetes 10/13/2021   B12 deficiency 10/13/2021   Pure hypercholesterolemia 10/13/2021   Fatty liver 06/17/2021   Atherosclerosis of aorta 06/17/2021   Constipation due to outlet dysfunction 06/09/2021   Dyssynergic defecation 06/09/2021   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   Gastro-esophageal reflux disease without esophagitis 04/14/2021   Tortuous colon 04/14/2021   History of fracture of left hip 10/14/2020   Osteoarthritis 10/22/2019   Chronic neck pain 04/05/2019   Osteoporosis, post-menopausal 01/03/2019   Scoliosis of lumbar  spine 04/22/2016   Thoracic scoliosis 09/08/2015   Chondromalacia 03/10/2015   Hyperglycemia 12/24/2014   Cervical radiculitis 12/24/2014   Subclinical hypothyroidism 12/24/2014   Vitamin D  deficiency 12/24/2014   External hemorrhoid 12/24/2014   Chondrocalcinosis of right knee 12/24/2014   DDD (degenerative disc disease), lumbar 12/24/2014    Past Surgical History:  Procedure Laterality Date   arthroscopic knee Right 05/26/2014   CHOLECYSTECTOMY  02/28/1989   DILATION AND CURETTAGE OF UTERUS     HIP PINNING,CANNULATED Left 08/07/2020   Procedure: Left hip percutaneous pinning;  Surgeon: Tobie Priest, MD;  Location: [facility] ORS;  Service: Orthopedics;  Laterality: Left;   right ulna surgery Right 07/01/2010   TONSILLECTOMY AND ADENOIDECTOMY Bilateral 03/01/1959    OB History   No obstetric history on file.      Home Medications    Prior to Admission medications   Medication Sig Start Date End Date Taking? Authorizing Provider  Cholecalciferol (VITAMIN D ) 2000 units tablet Take 3,000 Units by mouth daily.    [provider]  cyanocobalamin  1000 MCG tablet Take 1,000 mcg by mouth 3 (three) times a week.    [provider]  Magnesium 200 MG TABS Take 400 mg by mouth daily.    [provider]  metoprolol succinate (TOPROL-XL) 25 MG 24 hr tablet Take 1 tablet by mouth daily. 07/11/22 12/07/23  [provider]  OVER THE COUNTER MEDICATION MULTIVIT-MINERALS/FOLIC ACID  (CENTRUM ADULT 50 PLUS ORAL)    [provider]    Family History Family History  Problem Relation Age of Onset   Osteoporosis Mother    Aneurysm Father     Social History Social History   Tobacco Use   Smoking status: Never   Smokeless tobacco: Never  Vaping Use   Vaping status: Never Used  Substance Use Topics   Alcohol use: No    Alcohol/week: 0.0 standard drinks of alcohol   Drug use: No     Allergies   Terazol 3 [terconazole], Augmentin [amoxicillin-pot  clavulanate], Macrobid [nitrofurantoin macrocrystal], and Nitrofurantoin   Review of Systems Review of Systems  Constitutional:  Positive for fatigue. Negative for chills and fever.  HENT:  Positive for congestion, rhinorrhea and sore throat. Negative for ear pain.   Respiratory:  Positive for cough. Negative for shortness of breath.   Neurological:  Positive for headaches.     Physical Exam Triage Vital Signs ED Triage Vitals  Encounter Vitals Group     BP 02/01/24 1331 136/78     Girls Systolic BP Percentile --      Girls Diastolic BP Percentile --      Boys Systolic BP Percentile --      Boys Diastolic BP Percentile --      Pulse Rate 02/01/24 1331 90     Resp 02/01/24 1331 18     Temp 02/01/24 1331 98.8 F (37.1 C)     Temp src --      SpO2 02/01/24 1331 98 %     Weight --      Height --      Head Circumference --      Peak Flow --      Pain Score 02/01/24 1318 6     Pain Loc --      Pain Education --      Exclude from Growth Chart --    No data found.  Updated Vital Signs BP 136/78   Pulse 90   Temp 98.8 F (37.1 C)   Resp 18   SpO2 98%   Visual Acuity Right Eye Distance:   Left Eye Distance:   Bilateral Distance:    Right Eye Near:   Left Eye Near:    Bilateral Near:     Physical Exam Constitutional:      General: She is not in acute distress. HENT:     Right Ear: Tympanic membrane normal.     Left Ear: Tympanic membrane normal.     Nose: Rhinorrhea present.     Mouth/Throat:     Mouth: Mucous membranes are moist.     Pharynx: Oropharynx is clear.  Cardiovascular:     Rate and Rhythm: Normal rate and regular rhythm.     Heart sounds: Normal heart sounds.  Pulmonary:     Effort: Pulmonary effort is normal. No respiratory distress.     Breath sounds: Normal breath sounds.  Neurological:     Mental Status: She is alert.      UC Treatments / Results  Labs (all labs ordered are listed, but only abnormal results are displayed) Labs  Reviewed  POC COVID19/FLU A&B COMBO - Normal    EKG   Radiology No results found.  Procedures Procedures (including critical care time)  Medications Ordered in UC Medications - No data to display  Initial Impression / Assessment and Plan / UC Course  I have reviewed the triage vital signs and the nursing notes.  Pertinent labs & imaging results that were available  during my care of the patient were reviewed by me and considered in my medical decision making (see chart for details).    Viral URI.  Afebrile and vital signs are stable.  Lungs are clear and O2 sat is 98% on room air.  Rapid COVID and flu negative.  Discussed symptomatic treatment including Tylenol  or ibuprofen as needed for fever or discomfort, plain Mucinex as needed for congestion, rest, hydration.  Instructed patient to follow-up with her PCP if not improving.  ED precautions given.  Patient agrees to plan of care.  Final Clinical Impressions(s) / UC Diagnoses   Final diagnoses:  Viral URI     Discharge Instructions      The COVID and flu tests are negative.   Take Tylenol  as needed for fever or discomfort.  Take plain Mucinex as needed for congestion.  Rest and keep yourself hydrated.    Follow-up with your primary care provider if your symptoms are not improving.         ED Prescriptions   None    PDMP not reviewed this encounter.   Corlis Burnard DEL, NP 02/01/24 1350

## 2024-06-06 ENCOUNTER — Ambulatory Visit: Admitting: Family Medicine

## 2024-09-19 ENCOUNTER — Ambulatory Visit
